# Patient Record
Sex: Female | Born: 1975 | Race: Black or African American | Hispanic: No | Marital: Single | Smoking: Never smoker
Health system: Southern US, Community
[De-identification: ages and names within clinical notes are randomized; demographics above are authoritative.]

## PROBLEM LIST (undated history)

## (undated) ENCOUNTER — Emergency Department (HOSPITAL_COMMUNITY): Discharge status: Absent without leave | Payer: 59

## (undated) DIAGNOSIS — D473 Essential (hemorrhagic) thrombocythemia: Secondary | ICD-10-CM

## (undated) DIAGNOSIS — B009 Herpesviral infection, unspecified: Secondary | ICD-10-CM

## (undated) HISTORY — PX: NO PAST SURGERIES: SHX2092

## (undated) HISTORY — DX: Herpesviral infection, unspecified: B00.9

---
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
## 1999-05-01 ENCOUNTER — Other Ambulatory Visit: Admission: RE | Admit: 1999-05-01 | Discharge: 1999-05-01 | Payer: Self-pay | Admitting: Obstetrics and Gynecology

## 2000-03-13 ENCOUNTER — Emergency Department (HOSPITAL_COMMUNITY): Admission: EM | Admit: 2000-03-13 | Discharge: 2000-03-13 | Payer: Self-pay | Admitting: Emergency Medicine

## 2000-05-01 ENCOUNTER — Other Ambulatory Visit: Admission: RE | Admit: 2000-05-01 | Discharge: 2000-05-01 | Payer: Self-pay | Admitting: *Deleted

## 2001-05-07 ENCOUNTER — Other Ambulatory Visit: Admission: RE | Admit: 2001-05-07 | Discharge: 2001-05-07 | Payer: Self-pay | Admitting: Obstetrics and Gynecology

## 2002-05-12 ENCOUNTER — Other Ambulatory Visit: Admission: RE | Admit: 2002-05-12 | Discharge: 2002-05-12 | Payer: Self-pay | Admitting: Obstetrics and Gynecology

## 2003-05-24 ENCOUNTER — Other Ambulatory Visit: Admission: RE | Admit: 2003-05-24 | Discharge: 2003-05-24 | Payer: Self-pay | Admitting: Obstetrics and Gynecology

## 2008-07-16 LAB — CONVERTED CEMR LAB: Pap Smear: NORMAL

## 2008-12-29 ENCOUNTER — Ambulatory Visit: Payer: Self-pay | Admitting: Internal Medicine

## 2008-12-29 DIAGNOSIS — R42 Dizziness and giddiness: Secondary | ICD-10-CM | POA: Insufficient documentation

## 2008-12-29 DIAGNOSIS — A630 Anogenital (venereal) warts: Secondary | ICD-10-CM | POA: Insufficient documentation

## 2009-03-07 ENCOUNTER — Ambulatory Visit: Payer: Self-pay | Admitting: Internal Medicine

## 2009-03-08 LAB — CONVERTED CEMR LAB
ALT: 23 units/L (ref 0–35)
AST: 22 units/L (ref 0–37)
Albumin: 3.9 g/dL (ref 3.5–5.2)
Basophils Absolute: 0 10*3/uL (ref 0.0–0.1)
Bilirubin Urine: NEGATIVE
Calcium: 9 mg/dL (ref 8.4–10.5)
Cholesterol: 173 mg/dL (ref 0–200)
Creatinine, Ser: 1 mg/dL (ref 0.4–1.2)
GFR calc non Af Amer: 66.9 mL/min (ref >60)
HCT: 42.4 % (ref 36.0–46.0)
HDL: 72.6 mg/dL (ref >39.00)
Hemoglobin: 14 g/dL (ref 12.0–15.0)
Ketones, ur: NEGATIVE mg/dL
Leukocytes, UA: NEGATIVE
Lymphs Abs: 2.1 10*3/uL (ref 0.7–4.0)
MCV: 93.1 fL (ref 78.0–100.0)
Monocytes Absolute: 0.7 10*3/uL (ref 0.1–1.0)
Monocytes Relative: 11.5 % (ref 3.0–12.0)
Neutro Abs: 3.2 10*3/uL (ref 1.4–7.7)
Nitrite: NEGATIVE
Platelets: 361 10*3/uL (ref 150.0–400.0)
RDW: 12.8 % (ref 11.5–14.6)
Sodium: 140 meq/L (ref 135–145)
TSH: 0.97 microintl units/mL (ref 0.35–5.50)
Total Bilirubin: 2.1 mg/dL — ABNORMAL HIGH (ref 0.3–1.2)
Total CHOL/HDL Ratio: 2
Triglycerides: 35 mg/dL (ref 0.0–149.0)
Urobilinogen, UA: 0.2 (ref 0.0–1.0)
pH: 7 (ref 5.0–8.0)

## 2009-03-10 ENCOUNTER — Ambulatory Visit: Payer: Self-pay | Admitting: Internal Medicine

## 2009-03-10 DIAGNOSIS — R599 Enlarged lymph nodes, unspecified: Secondary | ICD-10-CM | POA: Insufficient documentation

## 2009-03-29 ENCOUNTER — Encounter: Admission: RE | Admit: 2009-03-29 | Discharge: 2009-03-29 | Payer: Self-pay | Admitting: Internal Medicine

## 2009-03-30 ENCOUNTER — Encounter (INDEPENDENT_AMBULATORY_CARE_PROVIDER_SITE_OTHER): Payer: Self-pay | Admitting: *Deleted

## 2009-06-24 ENCOUNTER — Encounter: Admission: RE | Admit: 2009-06-24 | Discharge: 2009-08-04 | Payer: Self-pay | Admitting: Orthopedic Surgery

## 2010-12-07 ENCOUNTER — Encounter: Payer: Self-pay | Admitting: Internal Medicine

## 2010-12-11 ENCOUNTER — Other Ambulatory Visit: Payer: Self-pay | Admitting: Internal Medicine

## 2010-12-11 ENCOUNTER — Other Ambulatory Visit (INDEPENDENT_AMBULATORY_CARE_PROVIDER_SITE_OTHER): Payer: BC Managed Care – PPO

## 2010-12-11 DIAGNOSIS — Z0000 Encounter for general adult medical examination without abnormal findings: Secondary | ICD-10-CM

## 2010-12-11 LAB — CBC WITH DIFFERENTIAL/PLATELET
Eosinophils Absolute: 0.1 10*3/uL (ref 0.0–0.7)
Eosinophils Relative: 2 % (ref 0.0–5.0)
HCT: 39.7 % (ref 36.0–46.0)
Lymphs Abs: 2.1 10*3/uL (ref 0.7–4.0)
MCHC: 34 g/dL (ref 30.0–36.0)
MCV: 91.5 fl (ref 78.0–100.0)
Monocytes Absolute: 0.7 10*3/uL (ref 0.1–1.0)
Platelets: 366 10*3/uL (ref 150.0–400.0)
WBC: 7 10*3/uL (ref 4.5–10.5)

## 2010-12-11 LAB — URINALYSIS
Ketones, ur: NEGATIVE
Specific Gravity, Urine: 1.025 (ref 1.000–1.030)
Total Protein, Urine: NEGATIVE
Urine Glucose: NEGATIVE
Urobilinogen, UA: 0.2 (ref 0.0–1.0)
pH: 6.5 (ref 5.0–8.0)

## 2010-12-11 LAB — HEPATIC FUNCTION PANEL
ALT: 17 U/L (ref 0–35)
AST: 22 U/L (ref 0–37)
Alkaline Phosphatase: 42 U/L (ref 39–117)
Bilirubin, Direct: 0.2 mg/dL (ref 0.0–0.3)
Total Bilirubin: 2.2 mg/dL — ABNORMAL HIGH (ref 0.3–1.2)

## 2010-12-11 LAB — LIPID PANEL
Cholesterol: 170 mg/dL (ref 0–200)
Triglycerides: 37 mg/dL (ref 0.0–149.0)

## 2010-12-11 LAB — BASIC METABOLIC PANEL
BUN: 12 mg/dL (ref 6–23)
Calcium: 8.9 mg/dL (ref 8.4–10.5)
Creatinine, Ser: 1 mg/dL (ref 0.4–1.2)
GFR: 84.02 mL/min (ref >60.00)

## 2010-12-14 ENCOUNTER — Ambulatory Visit (INDEPENDENT_AMBULATORY_CARE_PROVIDER_SITE_OTHER): Payer: BC Managed Care – PPO | Admitting: Internal Medicine

## 2010-12-14 ENCOUNTER — Encounter: Payer: Self-pay | Admitting: Internal Medicine

## 2010-12-14 VITALS — BP 100/62 | HR 72 | Temp 98.8°F | Ht 63.0 in | Wt 122.8 lb

## 2010-12-14 DIAGNOSIS — Z0000 Encounter for general adult medical examination without abnormal findings: Secondary | ICD-10-CM

## 2010-12-14 DIAGNOSIS — M6740 Ganglion, unspecified site: Secondary | ICD-10-CM

## 2010-12-14 NOTE — Patient Instructions (Signed)
It was good to see you today. We have reviewed your prior records including labs and tests today - exam looks great Stay active and eat well to maintain health as we discussed Please schedule followup annually for exam and labs, call sooner if problems. Let me know if the "lump" on your right hand causes any problems or any changes for evaluation by hand specialist

## 2010-12-14 NOTE — Progress Notes (Signed)
Subjective:    Patient ID: Darlene Davis, female    DOB: 02/03/1976, 35 y.o.   MRN: [id number]  HPI patient is here today for annual physical. Patient feels well and has no complaints. Past Medical History  Diagnosis Date  . VENEREAL WART   . POSTURAL LIGHTHEADEDNESS 12/29/2008  . 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 Hypertension Mother   . Hyperlipidemia Maternal Uncle   . Diabetes Maternal Grandmother    History  Substance Use Topics  . Smoking status: Never Smoker   . Smokeless tobacco: Not on file   Comment: Single-lives alone  . Alcohol Use:     Review of Systems  Constitutional: Negative for fever.  Respiratory: Negative for cough and shortness of breath.   Cardiovascular: Negative for chest pain.  Gastrointestinal: Negative for abdominal pain.  Musculoskeletal: Negative for gait problem.  Skin: Negative for rash.  Neurological: Negative for dizziness.  No other specific complaints in a complete review of systems (except as listed in HPI above).     Objective:   Physical Exam BP 100/62  Pulse 72  Temp(Src) 98.8 F (37.1 C) (Oral)  Ht 5\' 3"  (1.6 m)  Wt 122 lb 12.8 oz (55.702 kg)  BMI 21.75 kg/m2  SpO2 99% Wt Readings from Last 3 Encounters:  12/14/10 122 lb 12.8 oz (55.702 kg)  03/10/09 117 lb (53.071 kg)  12/29/08 120 lb (54.432 kg)   Physical Exam  Constitutional: She is oriented to person, place, and time. She appears well-developed and well-nourished. No distress.  Fit, healthy HENT: Head: Normocephalic and atraumatic. Ears: TMs B clear without effusion and erythema Nose: Nose normal.  Mouth/Throat: Oropharynx is clear and moist. No oropharyngeal exudate.  Eyes: Conjunctivae and EOM are normal. Pupils are equal, round, and reactive to light. No scleral icterus.  Neck: Normal range of motion. Neck supple. No JVD present. No thyromegaly present.  Cardiovascular: Normal rate, regular  rhythm and normal heart sounds.  No murmur heard. Pulmonary/Chest: Effort normal and breath sounds normal. No respiratory distress. She has no wheezes.  Abdominal: Soft. Bowel sounds are normal. She exhibits no distension. There is no tenderness.  Musculoskeletal: Normal range of motion. She exhibits no edema. Ganglion cyst off flexor tendon of R 2nd finger under MCP region of palm - firm nontender "BB" -  Neurological: She is alert and oriented to person, place, and time. No cranial nerve deficit. Coordination normal.  Skin: Skin is warm and dry. No rash noted. No erythema.  Psychiatric: She has a normal mood and affect. Her behavior is normal. Judgment and thought content normal.   Lab Results  Component Value Date   WBC 7.0 12/11/2010   HGB 13.5 12/11/2010   HCT 39.7 12/11/2010   PLT 366.0 12/11/2010   CHOL 170 12/11/2010   TRIG 37.0 12/11/2010   HDL 63.30 12/11/2010   ALT 17 12/11/2010   AST 22 12/11/2010   NA 139 12/11/2010   K 4.5 12/11/2010   CL 105 12/11/2010   CREATININE 1.0 12/11/2010   BUN 12 12/11/2010   CO2 28 12/11/2010   TSH 0.87 12/11/2010        Assessment & Plan:  CPX - v70.0 - Patient has been counseled on age-appropriate routine health concerns for screening and prevention. These are reviewed and up-to-date. Immunizations are up-to-date or declined. Labs and ECG reviewed.  Ganglion cyst off flexion tendon R 2nd finger -  education and reassurance provided - will call if becomes painful or tender or interfering with grip activity

## 2011-04-08 IMAGING — US US PELVIS COMPLETE
1 series · 14 of 25 positions shown · non-contrast
Comparison: None

CLINICAL DATA: Left inguinal lymphadenopathy.  Evaluate for ovarian
or pelvic mass.

TRANSABDOMINAL AND TRANSVAGINAL ULTRASOUND OF PELVIS
TECHNIQUE: Both transabdominal and transvaginal ultrasound
examinations of the pelvis were performed including evaluation of
the uterus, ovaries, adnexal regions, and pelvic cul-de-sac.

[Series 1: us pelvis complete · 0.27mm/px · 14 of 70 slices shown]
[im 1/70]
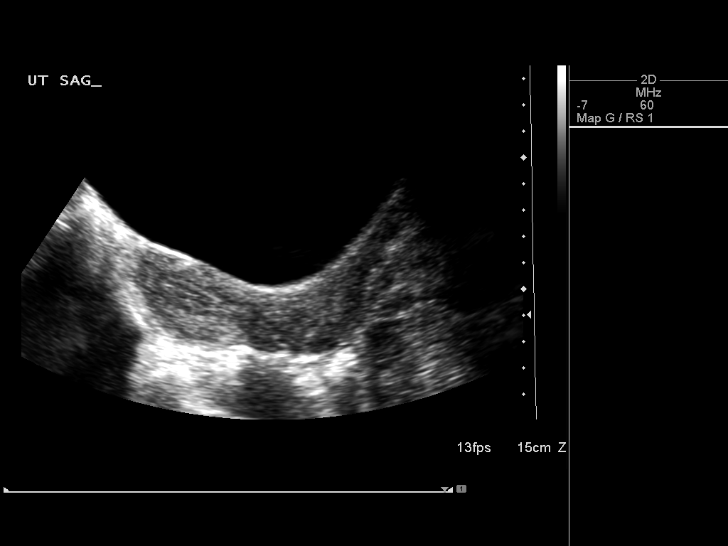
[im 6/70]
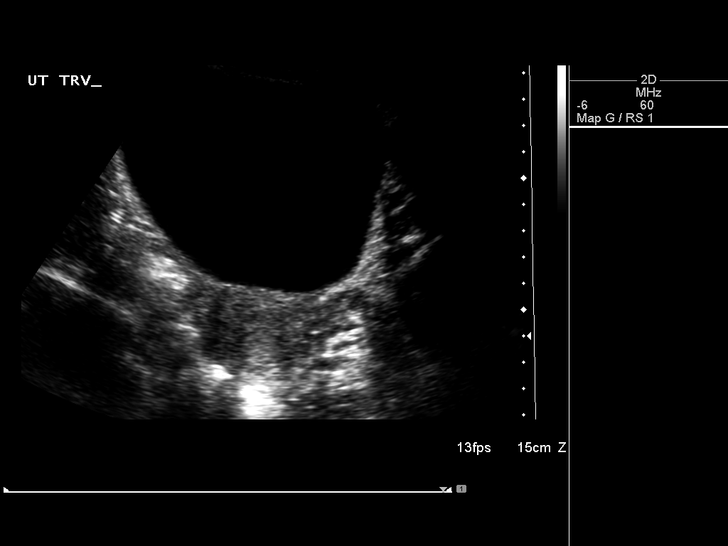
[im 12/70]
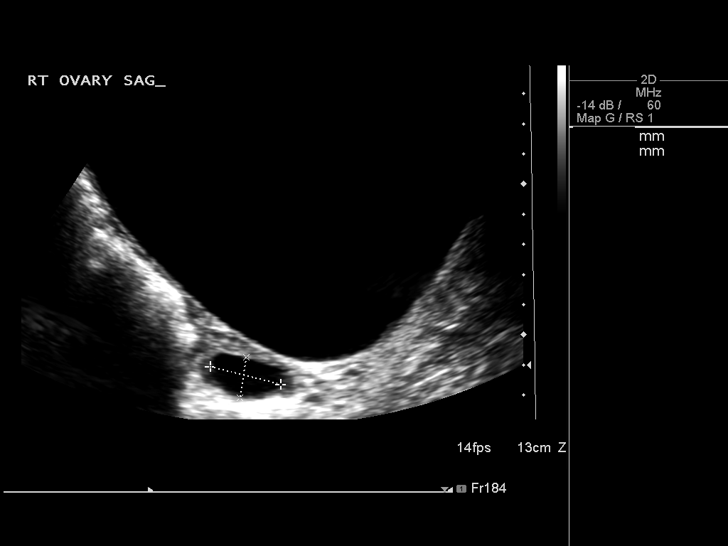
[im 18/70]
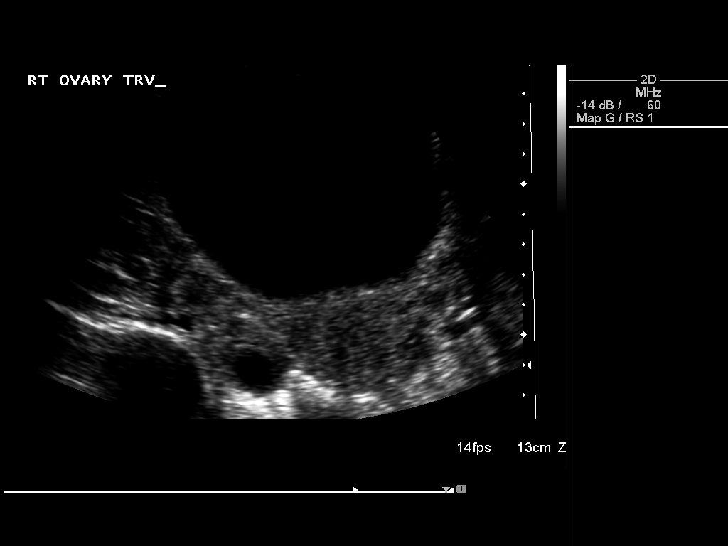
[im 24/70]
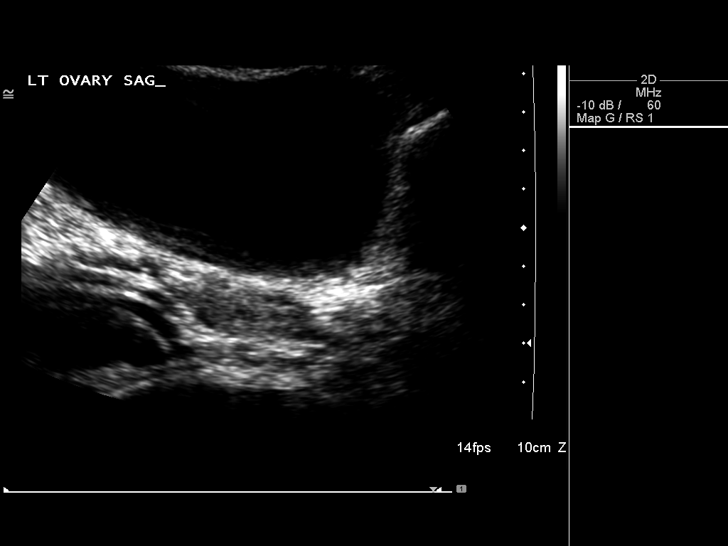
[im 26/70]
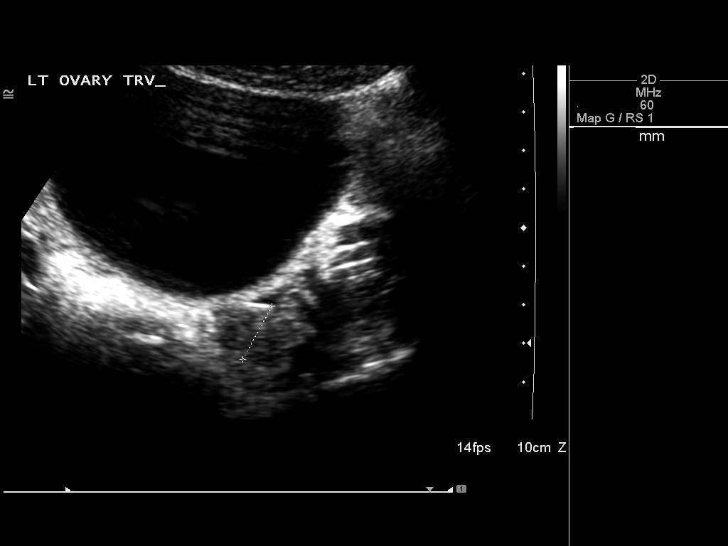
[im 32/70]
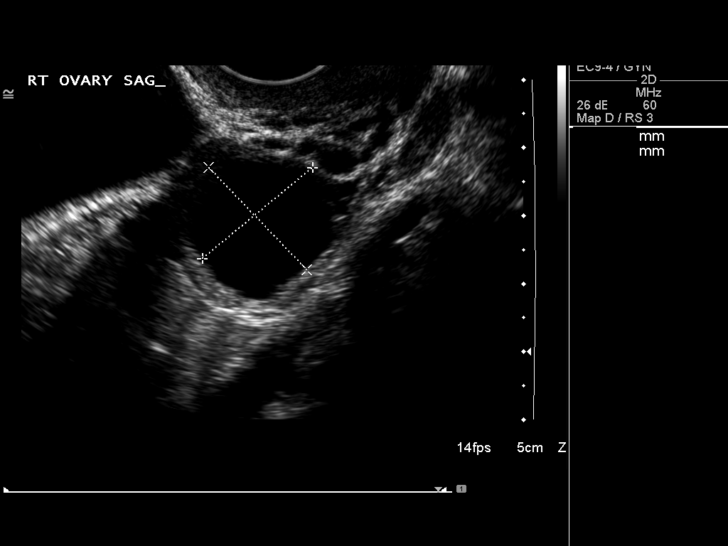
[im 38/70]
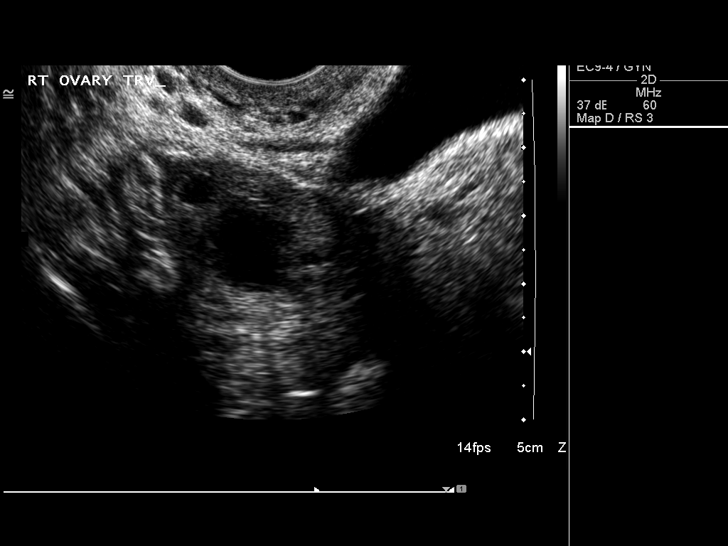
[im 44/70]
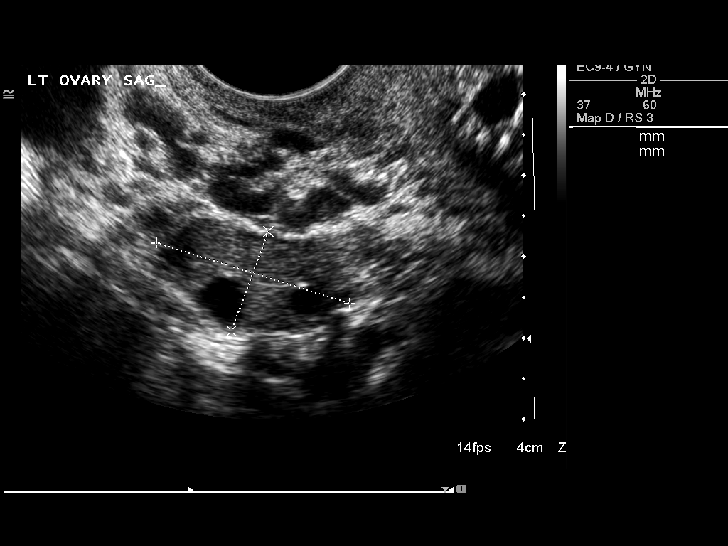
[im 47/70]
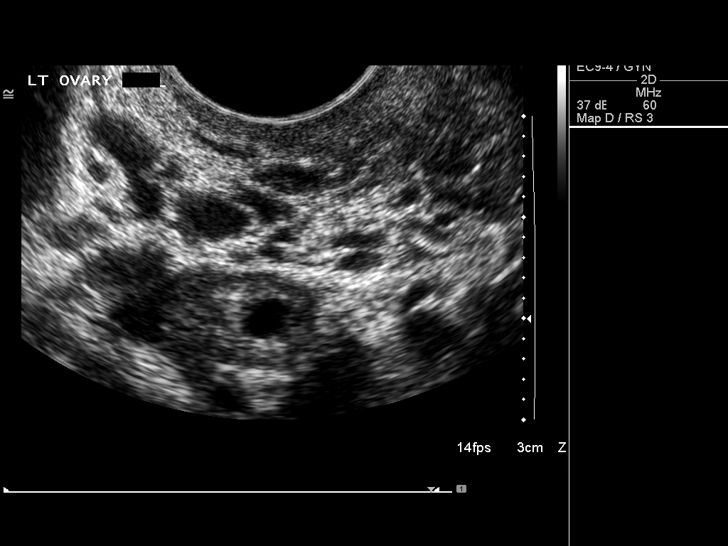
[im 52/70]
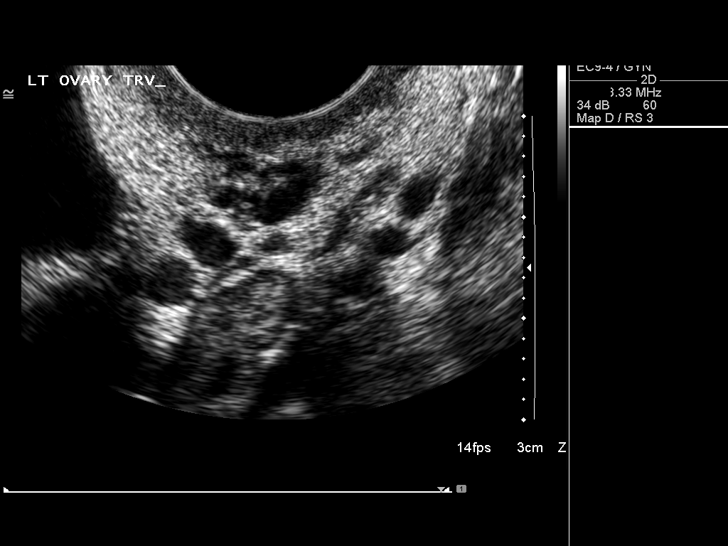
[im 58/70]
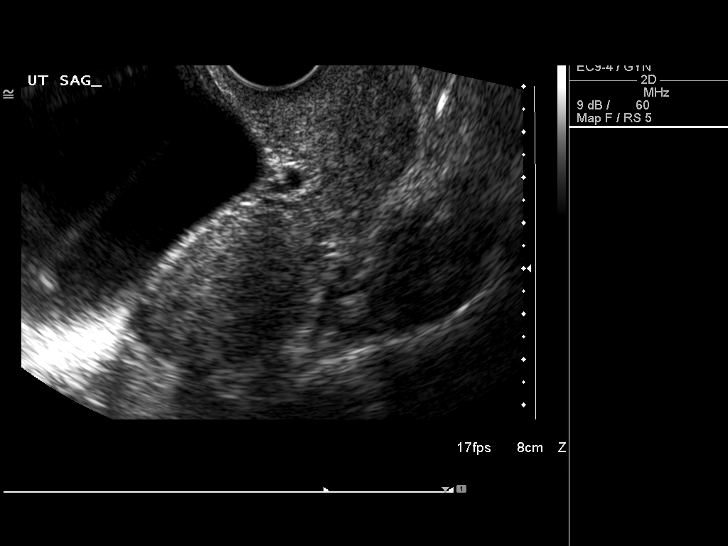
[im 64/70]
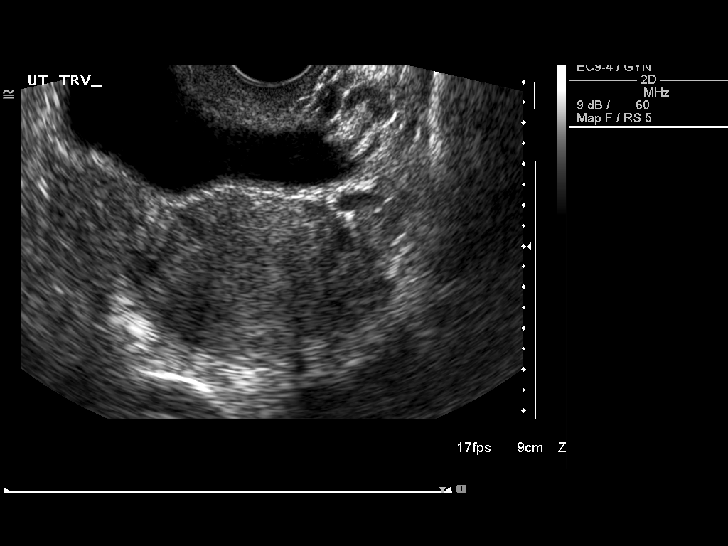
[im 70/70]
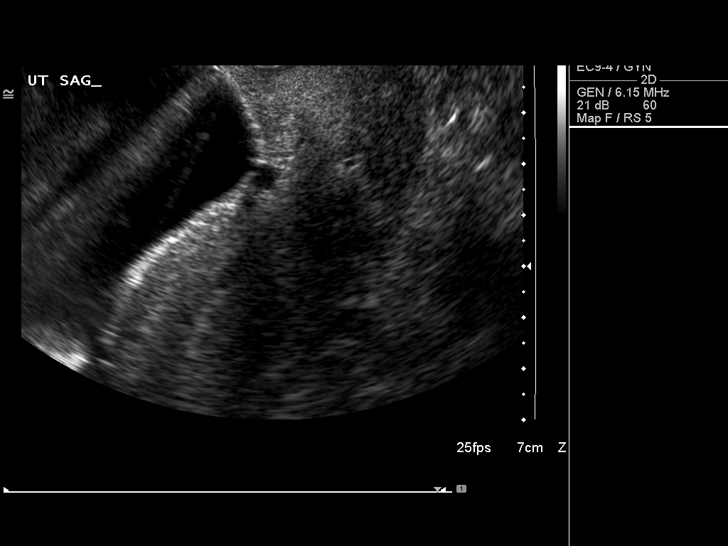

[14 of 25 positions shown; findings below may reference images not displayed]

FINDINGS: Uterus measures 8.0 x 3.1 x 5.7 cm. No fibroids or other uterine
masses identified.

Endometrium measures 7 mm in thickness.  Within normal limits in
appearance.

Right Ovary measures 3.5 x 2.4 x 2.6 cm. Normal appearance.  2 cm
dominant follicle noted.

Left Ovary measures 2.5 x 1.3 x 1.3 cm.  Normal appearance.

Other Findings:  No adnexal mass or free fluid identified.
IMPRESSION: Normal study.  No evidence of pelvic or ovarian mass.

## 2011-12-07 ENCOUNTER — Telehealth: Payer: Self-pay | Admitting: *Deleted

## 2011-12-07 DIAGNOSIS — Z0000 Encounter for general adult medical examination without abnormal findings: Secondary | ICD-10-CM

## 2011-12-07 NOTE — Telephone Encounter (Signed)
Received staff msg pt made cpx appt need labs enterted in [facility]... 12/07/11@8 :56am/LMB

## 2011-12-07 NOTE — Telephone Encounter (Signed)
Message copied by Deatra James on Fri Dec 07, 2011  8:56 AM ------      Message from: COUSIN, [place] T      Created: Fri Dec 07, 2011  8:52 AM      Regarding: PHY DATE:  02/22/12       THANKS

## 2011-12-13 ENCOUNTER — Other Ambulatory Visit: Payer: BC Managed Care – PPO

## 2011-12-18 ENCOUNTER — Encounter: Payer: BC Managed Care – PPO | Admitting: Internal Medicine

## 2012-02-19 ENCOUNTER — Other Ambulatory Visit (INDEPENDENT_AMBULATORY_CARE_PROVIDER_SITE_OTHER): Payer: BC Managed Care – PPO

## 2012-02-19 DIAGNOSIS — Z0000 Encounter for general adult medical examination without abnormal findings: Secondary | ICD-10-CM

## 2012-02-19 LAB — BASIC METABOLIC PANEL
BUN: 8 mg/dL (ref 6–23)
CO2: 28 mEq/L (ref 19–32)
Chloride: 105 mEq/L (ref 96–112)
Creatinine, Ser: 1 mg/dL (ref 0.4–1.2)
Glucose, Bld: 82 mg/dL (ref 70–99)

## 2012-02-19 LAB — CBC WITH DIFFERENTIAL/PLATELET
Basophils Relative: 1 % (ref 0.0–3.0)
Hemoglobin: 13.1 g/dL (ref 12.0–15.0)
Lymphocytes Relative: 34.9 % (ref 12.0–46.0)
Monocytes Relative: 9.6 % (ref 3.0–12.0)
Neutro Abs: 3.6 10*3/uL (ref 1.4–7.7)
RBC: 4.41 Mil/uL (ref 3.87–5.11)

## 2012-02-19 LAB — HEPATIC FUNCTION PANEL
ALT: 17 U/L (ref 0–35)
AST: 23 U/L (ref 0–37)
Albumin: 3.6 g/dL (ref 3.5–5.2)

## 2012-02-19 LAB — LIPID PANEL
Cholesterol: 183 mg/dL (ref 0–200)
LDL Cholesterol: 94 mg/dL (ref 0–99)
Total CHOL/HDL Ratio: 2

## 2012-02-19 LAB — URINALYSIS, ROUTINE W REFLEX MICROSCOPIC
Bilirubin Urine: NEGATIVE
Hgb urine dipstick: NEGATIVE
Leukocytes, UA: NEGATIVE
Nitrite: NEGATIVE
Total Protein, Urine: NEGATIVE

## 2012-02-22 ENCOUNTER — Encounter: Payer: Self-pay | Admitting: Internal Medicine

## 2012-02-22 ENCOUNTER — Other Ambulatory Visit: Payer: Self-pay

## 2012-02-22 ENCOUNTER — Ambulatory Visit (INDEPENDENT_AMBULATORY_CARE_PROVIDER_SITE_OTHER): Payer: 59 | Admitting: Internal Medicine

## 2012-02-22 VITALS — BP 124/68 | HR 76 | Temp 97.8°F | Ht 63.0 in | Wt 124.2 lb

## 2012-02-22 DIAGNOSIS — Z0000 Encounter for general adult medical examination without abnormal findings: Secondary | ICD-10-CM

## 2012-02-22 NOTE — Progress Notes (Signed)
  Subjective:    Patient ID: Darlene Davis, female    DOB: 1976-01-09, 36 y.o.   MRN: [id number]  HPI  patient is here today for annual physical. Patient feels well and has no complaints.  Past Medical History  Diagnosis Date  . 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 Hypertension Mother   . Hyperlipidemia Maternal Uncle   . Diabetes Maternal Grandmother    History  Substance Use Topics  . Smoking status: Never Smoker   . Smokeless tobacco: Not on file   Comment: Single-lives alone  . Alcohol Use:     Review of Systems Constitutional: Negative for fever or weight change.  Respiratory: Negative for cough and shortness of breath.   Cardiovascular: Negative for chest pain or palpitations.  Gastrointestinal: Negative for abdominal pain, no bowel changes.  Musculoskeletal: Negative for gait problem or joint swelling.  Skin: Negative for rash.  Neurological: Negative for dizziness or headache.  No other specific complaints in a complete review of systems (except as listed in HPI above).     Objective:   Physical Exam BP 124/68  Pulse 76  Temp 97.8 F (36.6 C) (Temporal)  Ht 5\' 3"  (1.6 m)  Wt 124 lb 3.2 oz (56.337 kg)  BMI 22.00 kg/m2  SpO2 98%  LMP 02/20/2012 Wt Readings from Last 3 Encounters:  02/22/12 124 lb 3.2 oz (56.337 kg)  12/14/10 122 lb 12.8 oz (55.702 kg)  03/10/09 117 lb (53.071 kg)   Constitutional: She appears well-developed and well-nourished. No distress.  HENT: Head: Normocephalic and atraumatic. Ears: B TMs ok, no erythema or effusion; Nose: Nose normal.  Mouth/Throat: Oropharynx is clear and moist. No oropharyngeal exudate.  Eyes: Conjunctivae and EOM are normal. Pupils are equal, round, and reactive to light. No scleral icterus.  Neck: Normal range of motion. Neck supple. No JVD present. No thyromegaly present.  Cardiovascular: Normal rate, regular rhythm and normal heart sounds.  No  murmur heard. No BLE edema. Pulmonary/Chest: Effort normal and breath sounds normal. No respiratory distress. She has no wheezes.  Abdominal: Soft. Bowel sounds are normal. She exhibits no distension. There is no tenderness. no masses Musculoskeletal: Normal range of motion, no joint effusions. No gross deformities Neurological: She is alert and oriented to person, place, and time. No cranial nerve deficit. Coordination normal.  Skin: Skin is warm and dry. No rash noted. No erythema.  Psychiatric: She has a normal mood and affect. Her behavior is normal. Judgment and thought content normal.   Lab Results  Component Value Date   WBC 7.0 02/19/2012   HGB 13.1 02/19/2012   HCT 39.9 02/19/2012   PLT 415.0* 02/19/2012   GLUCOSE 82 02/19/2012   CHOL 183 02/19/2012   TRIG 47.0 02/19/2012   HDL 79.20 02/19/2012   LDLCALC 94 02/19/2012   ALT 17 02/19/2012   AST 23 02/19/2012   NA 140 02/19/2012   K 4.4 02/19/2012   CL 105 02/19/2012   CREATININE 1.0 02/19/2012   BUN 8 02/19/2012   CO2 28 02/19/2012   TSH 0.89 02/19/2012        Assessment & Plan:   CPX/v70.0 - Patient has been counseled on age-appropriate routine health concerns for screening and prevention. These are reviewed and up-to-date. Immunizations are up-to-date or declined. Labs reviewed.

## 2012-02-22 NOTE — Patient Instructions (Addendum)
It was good to see you today. We have reviewed your prior records including labs and tests today Health Maintenance reviewed - all recommended immunizations and age-appropriate screenings are up-to-date. Medications reviewed, no changes at this time. Please schedule followup in 1 year for your medical physical/labs, call sooner if problems. Health Maintenance, Females A healthy lifestyle and preventative care can promote health and wellness.  Maintain regular health, dental, and eye exams.   Eat a healthy diet. Foods like vegetables, fruits, whole grains, low-fat dairy products, and lean protein foods contain the nutrients you need without too many calories. Decrease your intake of foods high in solid fats, added sugars, and salt. Get information about a proper diet from your caregiver, if necessary.   Regular physical exercise is one of the most important things you can do for your health. Most adults should get at least 150 minutes of moderate-intensity exercise (any activity that increases your heart rate and causes you to sweat) each week. In addition, most adults need muscle-strengthening exercises on 2 or more days a week.     Maintain a healthy weight. The body mass index (BMI) is a screening tool to identify possible weight problems. It provides an estimate of body fat based on height and weight. Your caregiver can help determine your BMI, and can help you achieve or maintain a healthy weight. For adults 20 years and older:   A BMI below 18.5 is considered underweight.   A BMI of 18.5 to 24.9 is normal.   A BMI of 25 to 29.9 is considered overweight.   A BMI of 30 and above is considered obese.   Maintain normal blood lipids and cholesterol by exercising and minimizing your intake of saturated fat. Eat a balanced diet with plenty of fruits and vegetables. Blood tests for lipids and cholesterol should begin at age 20 and be repeated every 5 years. If your lipid or cholesterol levels  are high, you are over 50, or you are a high risk for heart disease, you may need your cholesterol levels checked more frequently. Ongoing high lipid and cholesterol levels should be treated with medicines if diet and exercise are not effective.   If you smoke, find out from your caregiver how to quit. If you do not use tobacco, do not start.   If you are pregnant, do not drink alcohol. If you are breastfeeding, be very cautious about drinking alcohol. If you are not pregnant and choose to drink alcohol, do not exceed 1 drink per day. One drink is considered to be 12 ounces (355 mL) of beer, 5 ounces (148 mL) of wine, or 1.5 ounces (44 mL) of liquor.   Avoid use of street drugs. Do not share needles with anyone. Ask for help if you need support or instructions about stopping the use of drugs.   High blood pressure causes heart disease and increases the risk of stroke. Blood pressure should be checked at least every 1 to 2 years. Ongoing high blood pressure should be treated with medicines, if weight loss and exercise are not effective.   If you are 55 to 38 years old, ask your caregiver if you should take aspirin to prevent strokes.   Diabetes screening involves taking a blood sample to check your fasting blood sugar level. This should be done once every 3 years, after age 45, if you are within normal weight and without risk factors for diabetes. Testing should be considered at a younger age or be carried out   more frequently if you are overweight and have at least 1 risk factor for diabetes.   Breast cancer screening is essential preventative care for women. You should practice "breast self-awareness." This means understanding the normal appearance and feel of your breasts and may include breast self-examination. Any changes detected, no matter how small, should be reported to a caregiver. Women in their 20s and 30s should have a clinical breast exam (CBE) by a caregiver as part of a regular health  exam every 1 to 3 years. After age 40, women should have a CBE every year. Starting at age 40, women should consider having a mammogram (breast X-ray) every year. Women who have a family history of breast cancer should talk to their caregiver about genetic screening. Women at a high risk of breast cancer should talk to their caregiver about having an MRI and a mammogram every year.   The Pap test is a screening test for cervical cancer. Women should have a Pap test starting at age 21. Between ages 21 and 29, Pap tests should be repeated every 2 years. Beginning at age 30, you should have a Pap test every 3 years as long as the past 3 Pap tests have been normal. If you had a hysterectomy for a problem that was not cancer or a condition that could lead to cancer, then you no longer need Pap tests. If you are between ages 65 and 70, and you have had normal Pap tests going back 10 years, you no longer need Pap tests. If you have had past treatment for cervical cancer or a condition that could lead to cancer, you need Pap tests and screening for cancer for at least 20 years after your treatment. If Pap tests have been discontinued, risk factors (such as a new sexual partner) need to be reassessed to determine if screening should be resumed. Some women have medical problems that increase the chance of getting cervical cancer. In these cases, your caregiver may recommend more frequent screening and Pap tests.   The human papillomavirus (HPV) test is an additional test that may be used for cervical cancer screening. The HPV test looks for the virus that can cause the cell changes on the cervix. The cells collected during the Pap test can be tested for HPV. The HPV test could be used to screen women aged 30 years and older, and should be used in women of any age who have unclear Pap test results. After the age of 30, women should have HPV testing at the same frequency as a Pap test.   Colorectal cancer can be detected  and often prevented. Most routine colorectal cancer screening begins at the age of 50 and continues through age 75. However, your caregiver may recommend screening at an earlier age if you have risk factors for colon cancer. On a yearly basis, your caregiver may provide home test kits to check for hidden blood in the stool. Use of a small camera at the end of a tube, to directly examine the colon (sigmoidoscopy or colonoscopy), can detect the earliest forms of colorectal cancer. Talk to your caregiver about this at age 50, when routine screening begins. Direct examination of the colon should be repeated every 5 to 10 years through age 75, unless early forms of pre-cancerous polyps or small growths are found.   Hepatitis C blood testing is recommended for all people born from 1945 through 1965 and any individual with known risks for hepatitis C.   Practice   safe sex. Use condoms and avoid high-risk sexual practices to reduce the spread of sexually transmitted infections (STIs). Sexually active women aged 25 and younger should be checked for Chlamydia, which is a common sexually transmitted infection. Older women with new or multiple partners should also be tested for Chlamydia. Testing for other STIs is recommended if you are sexually active and at increased risk.   Osteoporosis is a disease in which the bones lose minerals and strength with aging. This can result in serious bone fractures. The risk of osteoporosis can be identified using a bone density scan. Women ages 65 and over and women at risk for fractures or osteoporosis should discuss screening with their caregivers. Ask your caregiver whether you should be taking a calcium supplement or vitamin D to reduce the rate of osteoporosis.   Menopause can be associated with physical symptoms and risks. Hormone replacement therapy is available to decrease symptoms and risks. You should talk to your caregiver about whether hormone replacement therapy is right  for you.   Use sunscreen with a sun protection factor (SPF) of 30 or greater. Apply sunscreen liberally and repeatedly throughout the day. You should seek shade when your shadow is shorter than you. Protect yourself by wearing long sleeves, pants, a wide-brimmed hat, and sunglasses year round, whenever you are outdoors.   Notify your caregiver of new moles or changes in moles, especially if there is a change in shape or color. Also notify your caregiver if a mole is larger than the size of a pencil eraser.   Stay current with your immunizations.  Document Released: 02/05/2011 Document Revised: 07/12/2011 Document Reviewed: 02/05/2011 [employer] Patient Information 2012 [employer]. 

## 2012-05-20 ENCOUNTER — Ambulatory Visit (INDEPENDENT_AMBULATORY_CARE_PROVIDER_SITE_OTHER): Payer: 59 | Admitting: *Deleted

## 2012-05-20 DIAGNOSIS — Z23 Encounter for immunization: Secondary | ICD-10-CM

## 2012-06-16 ENCOUNTER — Encounter: Payer: Self-pay | Admitting: Internal Medicine

## 2012-06-16 ENCOUNTER — Ambulatory Visit (INDEPENDENT_AMBULATORY_CARE_PROVIDER_SITE_OTHER): Payer: 59 | Admitting: Internal Medicine

## 2012-06-16 VITALS — BP 114/72 | HR 80 | Temp 99.0°F | Ht 63.0 in

## 2012-06-16 DIAGNOSIS — J019 Acute sinusitis, unspecified: Secondary | ICD-10-CM

## 2012-06-16 MED ORDER — AMOXICILLIN-POT CLAVULANATE 875-125 MG PO TABS: 1.0000 | ORAL_TABLET | Freq: Two times a day (BID) | ORAL | Status: AC

## 2012-06-16 NOTE — Progress Notes (Signed)
  Subjective:    HPI  complains of head cold symptoms  Onset >1 week ago, wax/wane symptoms  associated with rhinorrhea, sneezing, sore throat, mild headache and low grade fever - mild bloody tinge to nasal discharge this AM Also myalgias, sinus pressure and mild chest congestion Mod but incomplete relief with OTC meds ? If precipitated by sick contacts  Past Medical History  Diagnosis Date  . 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    Review of Systems Constitutional: No fever or night sweats, no unexpected weight change Pulmonary: No pleurisy or hemoptysis Cardiovascular: No chest pain or palpitations     Objective:   Physical Exam BP 114/72  Pulse 80  Temp 99 F (37.2 C) (Oral)  Ht 5\' 3"  (1.6 m)  SpO2 99% GEN: mildly ill appearing and audible head/chest congestion HENT: NCAT, mild sinus tenderness bilaterally, nares with thick discharge - no ulceration or lesion, oropharynx mild erythema, no exudate Eyes: Vision grossly intact, no conjunctivitis Lungs: Clear to auscultation without rhonchi or wheeze, no increased work of breathing Cardiovascular: Regular rate and rhythm, no bilateral edema      Assessment & Plan:  Viral URI  - progression to acute sinusitis    Empiric antibiotics prescribed due to symptom duration greater than 7 days recommended OTC cough suppression -  Symptomatic care with Tylenol or Advil, hydration and rest -  salt gargle advised as needed

## 2012-06-16 NOTE — Patient Instructions (Addendum)
It was good to see you today. Augmentin antibiotics - Your prescription(s) have been submitted to your pharmacy. Please take as directed and contact our office if you believe you are having problem(s) with the medication(s). Alternate between ibuprofen and tylenol for aches, pain and fever symptoms as discussed Hydrate, rest and call if worse or unimprovedSinusitis Sinusitis is redness, soreness, and swelling (inflammation) of the paranasal sinuses. Paranasal sinuses are air pockets within the bones of your face (beneath the eyes, the middle of the forehead, or above the eyes). In healthy paranasal sinuses, mucus is able to drain out, and air is able to circulate through them by way of your nose. However, when your paranasal sinuses are inflamed, mucus and air can become trapped. This can allow bacteria and other germs to grow and cause infection. Sinusitis can develop quickly and last only a short time (acute) or continue over a long period (chronic). Sinusitis that lasts for more than 12 weeks is considered chronic.   CAUSES   Causes of sinusitis include:  Allergies.   Structural abnormalities, such as displacement of the cartilage that separates your nostrils (deviated septum), which can decrease the air flow through your nose and sinuses and affect sinus drainage.   Functional abnormalities, such as when the small hairs (cilia) that line your sinuses and help remove mucus do not work properly or are not present.  SYMPTOMS   Symptoms of acute and chronic sinusitis are the same. The primary symptoms are pain and pressure around the affected sinuses. Other symptoms include:  Upper toothache.   Earache.   Headache.   Bad breath.   Decreased sense of smell and taste.   A cough, which worsens when you are lying flat.   Fatigue.   Fever.   Thick drainage from your nose, which often is green and may contain pus (purulent).   Swelling and warmth over the affected sinuses.  DIAGNOSIS     Your caregiver will perform a physical exam. During the exam, your caregiver may:  Look in your nose for signs of abnormal growths in your nostrils (nasal polyps).   Tap over the affected sinus to check for signs of infection.   View the inside of your sinuses (endoscopy) with a special imaging device with a light attached (endoscope), which is inserted into your sinuses.  If your caregiver suspects that you have chronic sinusitis, one or more of the following tests may be recommended:  Allergy tests.   Nasal culture A sample of mucus is taken from your nose and sent to a lab and screened for bacteria.   Nasal cytology A sample of mucus is taken from your nose and examined by your caregiver to determine if your sinusitis is related to an allergy.  TREATMENT   Most cases of acute sinusitis are related to a viral infection and will resolve on their own within 10 days. Sometimes medicines are prescribed to help relieve symptoms (pain medicine, decongestants, nasal steroid sprays, or saline sprays).   However, for sinusitis related to a bacterial infection, your caregiver will prescribe antibiotic medicines. These are medicines that will help kill the bacteria causing the infection.   Rarely, sinusitis is caused by a fungal infection. In theses cases, your caregiver will prescribe antifungal medicine. For some cases of chronic sinusitis, surgery is needed. Generally, these are cases in which sinusitis recurs more than 3 times per year, despite other treatments. HOME CARE INSTRUCTIONS    Drink plenty of water. Water helps thin the  mucus so your sinuses can drain more easily.   Use a humidifier.   Inhale steam 3 to 4 times a day (for example, sit in the bathroom with the shower running).   Apply a warm, moist washcloth to your face 3 to 4 times a day, or as directed by your caregiver.   Use saline nasal sprays to help moisten and clean your sinuses.   Take over-the-counter or prescription  medicines for pain, discomfort, or fever only as directed by your caregiver.  SEEK IMMEDIATE MEDICAL CARE IF:  You have increasing pain or severe headaches.   You have nausea, vomiting, or drowsiness.   You have swelling around your face.   You have vision problems.   You have a stiff neck.   You have difficulty breathing.  MAKE SURE YOU:    Understand these instructions.   Will watch your condition.   Will get help right away if you are not doing well or get worse.  Document Released: 07/23/2005 Document Revised: 10/15/2011 Document Reviewed: 08/07/2011 [facility] Patient Information 2013 [place], [place].

## 2012-10-06 ENCOUNTER — Telehealth: Payer: Self-pay | Admitting: Internal Medicine

## 2012-10-06 MED ORDER — SCOPOLAMINE 1 MG/3DAYS TD PT72: 1.0000 | MEDICATED_PATCH | TRANSDERMAL | Status: DC

## 2012-10-06 NOTE — Telephone Encounter (Signed)
Pt is aware.  

## 2012-10-06 NOTE — Telephone Encounter (Signed)
Going on a cruise in 2 wks.  Would like something for sea sickness.  Maybe the patch that goes behind her ear.  [facility] on [address].

## 2012-10-06 NOTE — Telephone Encounter (Signed)
erx for scopolamine patch done

## 2013-02-19 ENCOUNTER — Other Ambulatory Visit (INDEPENDENT_AMBULATORY_CARE_PROVIDER_SITE_OTHER): Payer: 59

## 2013-02-19 ENCOUNTER — Telehealth: Payer: Self-pay | Admitting: *Deleted

## 2013-02-19 DIAGNOSIS — Z0000 Encounter for general adult medical examination without abnormal findings: Secondary | ICD-10-CM

## 2013-02-19 LAB — CBC WITH DIFFERENTIAL/PLATELET
Basophils Absolute: 0.1 10*3/uL (ref 0.0–0.1)
Eosinophils Absolute: 0.2 10*3/uL (ref 0.0–0.7)
Lymphocytes Relative: 34 % (ref 12.0–46.0)
MCHC: 33.2 g/dL (ref 30.0–36.0)
MCV: 90.9 fl (ref 78.0–100.0)
Monocytes Absolute: 0.8 10*3/uL (ref 0.1–1.0)
Neutrophils Relative %: 51.8 % (ref 43.0–77.0)
RDW: 14.2 % (ref 11.5–14.6)

## 2013-02-19 LAB — HEPATIC FUNCTION PANEL
ALT: 23 U/L (ref 0–35)
AST: 21 U/L (ref 0–37)
Alkaline Phosphatase: 40 U/L (ref 39–117)
Bilirubin, Direct: 0.2 mg/dL (ref 0.0–0.3)
Total Protein: 7 g/dL (ref 6.0–8.3)

## 2013-02-19 LAB — URINALYSIS, ROUTINE W REFLEX MICROSCOPIC
Bilirubin Urine: NEGATIVE
Hgb urine dipstick: NEGATIVE
Ketones, ur: NEGATIVE
Urine Glucose: NEGATIVE
Urobilinogen, UA: 0.2 (ref 0.0–1.0)

## 2013-02-19 LAB — BASIC METABOLIC PANEL
Calcium: 9.1 mg/dL (ref 8.4–10.5)
Creatinine, Ser: 1 mg/dL (ref 0.4–1.2)
GFR: 80.15 mL/min (ref >60.00)
Glucose, Bld: 71 mg/dL (ref 70–99)
Sodium: 137 mEq/L (ref 135–145)

## 2013-02-19 LAB — LIPID PANEL
HDL: 80.9 mg/dL (ref >39.00)
VLDL: 13.4 mg/dL (ref 0.0–40.0)

## 2013-02-19 NOTE — Telephone Encounter (Signed)
Left msg on vm pt is down there for cpx labs. No order in comp. Placing cpx labs...lmb

## 2013-02-24 ENCOUNTER — Ambulatory Visit (INDEPENDENT_AMBULATORY_CARE_PROVIDER_SITE_OTHER): Payer: 59 | Admitting: Internal Medicine

## 2013-02-24 ENCOUNTER — Encounter: Payer: Self-pay | Admitting: Internal Medicine

## 2013-02-24 VITALS — BP 102/70 | HR 73 | Temp 97.0°F | Ht 62.25 in | Wt 122.4 lb

## 2013-02-24 DIAGNOSIS — Z0000 Encounter for general adult medical examination without abnormal findings: Secondary | ICD-10-CM

## 2013-02-24 NOTE — Patient Instructions (Signed)
It was good to see you today. We have reviewed your prior records including labs and tests today Health Maintenance reviewed - all recommended immunizations and age-appropriate screenings are up-to-date. Medications reviewed, no changes at this time. Please schedule followup in 1 year for your medical physical/labs, call sooner if problems. Health Maintenance, Females A healthy lifestyle and preventative care can promote health and wellness.  Maintain regular health, dental, and eye exams.   Eat a healthy diet. Foods like vegetables, fruits, whole grains, low-fat dairy products, and lean protein foods contain the nutrients you need without too many calories. Decrease your intake of foods high in solid fats, added sugars, and salt. Get information about a proper diet from your caregiver, if necessary.   Regular physical exercise is one of the most important things you can do for your health. Most adults should get at least 150 minutes of moderate-intensity exercise (any activity that increases your heart rate and causes you to sweat) each week. In addition, most adults need muscle-strengthening exercises on 2 or more days a week.     Maintain a healthy weight. The body mass index (BMI) is a screening tool to identify possible weight problems. It provides an estimate of body fat based on height and weight. Your caregiver can help determine your BMI, and can help you achieve or maintain a healthy weight. For adults 20 years and older:   A BMI below 18.5 is considered underweight.   A BMI of 18.5 to 24.9 is normal.   A BMI of 25 to 29.9 is considered overweight.   A BMI of 30 and above is considered obese.   Maintain normal blood lipids and cholesterol by exercising and minimizing your intake of saturated fat. Eat a balanced diet with plenty of fruits and vegetables. Blood tests for lipids and cholesterol should begin at age 17 and be repeated every 5 years. If your lipid or cholesterol levels  are high, you are over 50, or you are a high risk for heart disease, you may need your cholesterol levels checked more frequently. Ongoing high lipid and cholesterol levels should be treated with medicines if diet and exercise are not effective.   If you smoke, find out from your caregiver how to quit. If you do not use tobacco, do not start.   If you are pregnant, do not drink alcohol. If you are breastfeeding, be very cautious about drinking alcohol. If you are not pregnant and choose to drink alcohol, do not exceed 1 drink per day. One drink is considered to be 12 ounces (355 mL) of beer, 5 ounces (148 mL) of wine, or 1.5 ounces (44 mL) of liquor.   Avoid use of street drugs. Do not share needles with anyone. Ask for help if you need support or instructions about stopping the use of drugs.   High blood pressure causes heart disease and increases the risk of stroke. Blood pressure should be checked at least every 1 to 2 years. Ongoing high blood pressure should be treated with medicines, if weight loss and exercise are not effective.   If you are 2 to 39 years old, ask your caregiver if you should take aspirin to prevent strokes.   Diabetes screening involves taking a blood sample to check your fasting blood sugar level. This should be done once every 3 years, after age 19, if you are within normal weight and without risk factors for diabetes. Testing should be considered at a younger age or be carried out  more frequently if you are overweight and have at least 1 risk factor for diabetes.   Breast cancer screening is essential preventative care for women. You should practice "breast self-awareness." This means understanding the normal appearance and feel of your breasts and may include breast self-examination. Any changes detected, no matter how small, should be reported to a caregiver. Women in their 43s and 30s should have a clinical breast exam (CBE) by a caregiver as part of a regular health  exam every 1 to 3 years. After age 39, women should have a CBE every year. Starting at age 90, women should consider having a mammogram (breast X-ray) every year. Women who have a family history of breast cancer should talk to their caregiver about genetic screening. Women at a high risk of breast cancer should talk to their caregiver about having an MRI and a mammogram every year.   The Pap test is a screening test for cervical cancer. Women should have a Pap test starting at age 46. Between ages 57 and 33, Pap tests should be repeated every 2 years. Beginning at age 31, you should have a Pap test every 3 years as long as the past 3 Pap tests have been normal. If you had a hysterectomy for a problem that was not cancer or a condition that could lead to cancer, then you no longer need Pap tests. If you are between ages 47 and 54, and you have had normal Pap tests going back 10 years, you no longer need Pap tests. If you have had past treatment for cervical cancer or a condition that could lead to cancer, you need Pap tests and screening for cancer for at least 20 years after your treatment. If Pap tests have been discontinued, risk factors (such as a new sexual partner) need to be reassessed to determine if screening should be resumed. Some women have medical problems that increase the chance of getting cervical cancer. In these cases, your caregiver may recommend more frequent screening and Pap tests.   The human papillomavirus (HPV) test is an additional test that may be used for cervical cancer screening. The HPV test looks for the virus that can cause the cell changes on the cervix. The cells collected during the Pap test can be tested for HPV. The HPV test could be used to screen women aged 43 years and older, and should be used in women of any age who have unclear Pap test results. After the age of 42, women should have HPV testing at the same frequency as a Pap test.   Colorectal cancer can be detected  and often prevented. Most routine colorectal cancer screening begins at the age of 35 and continues through age 47. However, your caregiver may recommend screening at an earlier age if you have risk factors for colon cancer. On a yearly basis, your caregiver may provide home test kits to check for hidden blood in the stool. Use of a small camera at the end of a tube, to directly examine the colon (sigmoidoscopy or colonoscopy), can detect the earliest forms of colorectal cancer. Talk to your caregiver about this at age 40, when routine screening begins. Direct examination of the colon should be repeated every 5 to 10 years through age 71, unless early forms of pre-cancerous polyps or small growths are found.   Hepatitis C blood testing is recommended for all people born from [birth year] through 1965 and any individual with known risks for hepatitis C.   Practice  safe sex. Use condoms and avoid high-risk sexual practices to reduce the spread of sexually transmitted infections (STIs). Sexually active women aged 68 and younger should be checked for Chlamydia, which is a common sexually transmitted infection. Older women with new or multiple partners should also be tested for Chlamydia. Testing for other STIs is recommended if you are sexually active and at increased risk.   Osteoporosis is a disease in which the bones lose minerals and strength with aging. This can result in serious bone fractures. The risk of osteoporosis can be identified using a bone density scan. Women ages 55 and over and women at risk for fractures or osteoporosis should discuss screening with their caregivers. Ask your caregiver whether you should be taking a calcium supplement or vitamin D to reduce the rate of osteoporosis.   Menopause can be associated with physical symptoms and risks. Hormone replacement therapy is available to decrease symptoms and risks. You should talk to your caregiver about whether hormone replacement therapy is right  for you.   Use sunscreen with a sun protection factor (SPF) of 30 or greater. Apply sunscreen liberally and repeatedly throughout the day. You should seek shade when your shadow is shorter than you. Protect yourself by wearing long sleeves, pants, a wide-brimmed hat, and sunglasses year round, whenever you are outdoors.   Notify your caregiver of new moles or changes in moles, especially if there is a change in shape or color. Also notify your caregiver if a mole is larger than the size of a pencil eraser.   Stay current with your immunizations.  Document Released: 02/05/2011 Document Revised: 07/12/2011 Document Reviewed: 02/05/2011 [facility] Patient Information 2012 [place], [place].

## 2013-02-24 NOTE — Progress Notes (Signed)
  Subjective:    Patient ID: Darlene Davis, female    DOB: November 18, 1975, 37 y.o.   MRN: [id number]  HPI   patient is here today for annual physical. Patient feels well and has no complaints.  Past Medical History  Diagnosis Date  . 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 Hypertension Mother   . Hyperlipidemia Maternal Uncle   . Diabetes Maternal Grandmother    History  Substance Use Topics  . Smoking status: Never Smoker   . Smokeless tobacco: Not on file     Comment: Single-lives alone - [employer], work from home  . Alcohol Use: Not on file    Review of Systems  Constitutional: Negative for fever or weight change.  Respiratory: Negative for cough and shortness of breath.   Cardiovascular: Negative for chest pain or palpitations.  Gastrointestinal: Negative for abdominal pain, no bowel changes.  Musculoskeletal: Negative for gait problem or joint swelling.  Skin: Negative for rash.  Neurological: Negative for dizziness or headache.  No other specific complaints in a complete review of systems (except as listed in HPI above).     Objective:   Physical Exam  BP 102/70  Pulse 73  Temp(Src) 97 F (36.1 C) (Oral)  Ht 5' 2.25" (1.581 m)  Wt 122 lb 6.4 oz (55.52 kg)  BMI 22.21 kg/m2  SpO2 99% Wt Readings from Last 3 Encounters:  02/24/13 122 lb 6.4 oz (55.52 kg)  02/22/12 124 lb 3.2 oz (56.337 kg)  12/14/10 122 lb 12.8 oz (55.702 kg)   Constitutional: She appears well-developed and well-nourished. No distress.  HENT: Head: Normocephalic and atraumatic. Ears: B TMs ok, no erythema or effusion; Nose: Nose normal. Mouth/Throat: Oropharynx is clear and moist. No oropharyngeal exudate.  Eyes: Conjunctivae and EOM are normal. Pupils are equal, round, and reactive to light. No scleral icterus.  Neck: Normal range of motion. Neck supple. No JVD present. No thyromegaly present.  Cardiovascular: Normal rate, regular  rhythm and normal heart sounds.  No murmur heard. No BLE edema. Pulmonary/Chest: Effort normal and breath sounds normal. No respiratory distress. She has no wheezes.  Abdominal: Soft. Bowel sounds are normal. She exhibits no distension. There is no tenderness. no masses Musculoskeletal: Normal range of motion, no joint effusions. No gross deformities Neurological: She is alert and oriented to person, place, and time. No cranial nerve deficit. Coordination normal.  Skin: Skin is warm and dry. No rash noted. No erythema.  Psychiatric: She has a normal mood and affect. Her behavior is normal. Judgment and thought content normal.   Lab Results  Component Value Date   WBC 7.7 02/19/2013   HGB 14.4 02/19/2013   HCT 43.4 02/19/2013   PLT 433.0* 02/19/2013   GLUCOSE 71 02/19/2013   CHOL 183 02/19/2013   TRIG 67.0 02/19/2013   HDL 80.90 02/19/2013   LDLCALC 89 02/19/2013   ALT 23 02/19/2013   AST 21 02/19/2013   NA 137 02/19/2013   K 3.9 02/19/2013   CL 102 02/19/2013   CREATININE 1.0 02/19/2013   BUN 9 02/19/2013   CO2 30 02/19/2013   TSH 1.57 02/19/2013        Assessment & Plan:   CPX/v70.0 - Patient has been counseled on age-appropriate routine health concerns for screening and prevention. These are reviewed and up-to-date. Immunizations are up-to-date or declined. Labs reviewed.

## 2013-05-18 ENCOUNTER — Encounter (HOSPITAL_COMMUNITY): Payer: Self-pay | Admitting: Emergency Medicine

## 2013-05-18 ENCOUNTER — Emergency Department (HOSPITAL_COMMUNITY)
Admission: EM | Admit: 2013-05-18 | Discharge: 2013-05-18 | Disposition: A | Discharge status: Home / Self Care | Payer: 59

## 2013-05-18 LAB — CBC WITH DIFFERENTIAL/PLATELET
Basophils Absolute: 0 10*3/uL (ref 0.0–0.1)
Basophils Relative: 0 % (ref 0–1)
Eosinophils Absolute: 0.1 10*3/uL (ref 0.0–0.7)
Eosinophils Relative: 2 % (ref 0–5)
Hemoglobin: 12.9 g/dL (ref 12.0–15.0)
MCH: 28.9 pg (ref 26.0–34.0)
MCHC: 34.3 g/dL (ref 30.0–36.0)
MCV: 84.1 fL (ref 78.0–100.0)
Monocytes Relative: 7 % (ref 3–12)
Neutrophils Relative %: 56 % (ref 43–77)

## 2013-05-18 LAB — COMPREHENSIVE METABOLIC PANEL
ALT: 17 U/L (ref 0–35)
Albumin: 3.7 g/dL (ref 3.5–5.2)
Alkaline Phosphatase: 81 U/L (ref 39–117)
BUN: 10 mg/dL (ref 6–23)
Calcium: 9.3 mg/dL (ref 8.4–10.5)
Creatinine, Ser: 0.96 mg/dL (ref 0.50–1.10)
GFR calc Af Amer: 85 mL/min — ABNORMAL LOW (ref >90)
GFR calc non Af Amer: 74 mL/min — ABNORMAL LOW (ref >90)
Glucose, Bld: 105 mg/dL — ABNORMAL HIGH (ref 70–99)
Potassium: 3.5 mEq/L (ref 3.5–5.1)
Total Protein: 7.1 g/dL (ref 6.0–8.3)

## 2013-05-18 NOTE — ED Notes (Signed)
Pt was registered under the wrong name .

## 2013-05-18 NOTE — ED Notes (Signed)
Pt here with c/o sob that started this am pt is also c/o stiff neck and back , pt very anxious in triage sats 99% on room air

## 2013-05-27 ENCOUNTER — Ambulatory Visit (INDEPENDENT_AMBULATORY_CARE_PROVIDER_SITE_OTHER): Payer: 59

## 2013-05-27 DIAGNOSIS — Z23 Encounter for immunization: Secondary | ICD-10-CM

## 2013-08-25 ENCOUNTER — Ambulatory Visit (HOSPITAL_BASED_OUTPATIENT_CLINIC_OR_DEPARTMENT_OTHER)
Admission: RE | Admit: 2013-08-25 | Discharge: 2013-08-25 | Disposition: A | Discharge status: Home / Self Care | Payer: 59 | Source: Ambulatory Visit | Attending: Internal Medicine | Admitting: Internal Medicine

## 2013-08-25 ENCOUNTER — Ambulatory Visit (INDEPENDENT_AMBULATORY_CARE_PROVIDER_SITE_OTHER): Payer: 59 | Admitting: Internal Medicine

## 2013-08-25 ENCOUNTER — Telehealth: Payer: Self-pay | Admitting: Internal Medicine

## 2013-08-25 ENCOUNTER — Encounter: Payer: Self-pay | Admitting: Internal Medicine

## 2013-08-25 VITALS — BP 119/77 | HR 115 | Temp 99.5°F | Wt 128.4 lb

## 2013-08-25 DIAGNOSIS — R509 Fever, unspecified: Secondary | ICD-10-CM

## 2013-08-25 DIAGNOSIS — B9789 Other viral agents as the cause of diseases classified elsewhere: Secondary | ICD-10-CM

## 2013-08-25 DIAGNOSIS — B349 Viral infection, unspecified: Secondary | ICD-10-CM

## 2013-08-25 DIAGNOSIS — R05 Cough: Secondary | ICD-10-CM | POA: Insufficient documentation

## 2013-08-25 DIAGNOSIS — R059 Cough, unspecified: Secondary | ICD-10-CM | POA: Insufficient documentation

## 2013-08-25 LAB — POCT INFLUENZA A/B
Influenza A, POC: NEGATIVE
Influenza B, POC: NEGATIVE

## 2013-08-25 LAB — POCT RAPID STREP A (OFFICE): Rapid Strep A Screen: NEGATIVE

## 2013-08-25 MED ORDER — OSELTAMIVIR PHOSPHATE 75 MG PO CAPS: 75.0000 mg | ORAL_CAPSULE | Freq: Two times a day (BID) | ORAL | Status: DC

## 2013-08-25 NOTE — Progress Notes (Signed)
Pre visit review using our clinic review tool, if applicable. No additional management support is needed unless otherwise documented below in the visit note. 

## 2013-08-25 NOTE — Telephone Encounter (Signed)
Patient wants to cancel request below. Please disregard.

## 2013-08-25 NOTE — Telephone Encounter (Signed)
Patient states Dr. Larose Kells wrote rx for tamiflu and it is too expensive. She would like to know if there is an alternative. Pt uses [facility].

## 2013-08-25 NOTE — Telephone Encounter (Signed)
Patient says she will pick up Tamiflu.     KP

## 2013-08-25 NOTE — Progress Notes (Signed)
   Subjective:    Patient ID: Darlene Davis, female    DOB: Apr 12, 1976, 38 y.o.   MRN: [id number]  HPI Acute visit, here with a friend Last night started to feel a little "congested at the throat ", this morning she woke up with chills, "chest congestion ", sore throat. In the office she was noted to be febrile. Has not taken any medication  Past Medical History  Diagnosis Date  . 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 No past surgeries     History   Social History  . Marital Status: Married    Spouse Name: N/A    Number of Children: N/A  . Years of Education: N/A   Occupational History  . Not on file.   Social History Main Topics  . Smoking status: Never Smoker   . Smokeless tobacco: Not on file     Comment: Single-lives alone - [employer], work from home  . Alcohol Use: Not on file  . Drug Use: Not on file  . Sexual Activity: Not on file   Other Topics Concern  . Not on file   Social History Narrative  . No narrative on file     Review of Systems Denies myalgias. Mild nausea since yesterday, no vomiting or diarrhea. Mild mid abdominal cramps. Denies dysuria or gross hematuria. Minimal if any cough. 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 TMs normal, throat symmetric, no redness or discharge. Face symmetric, sinuses not tender to palpation. Nose slt  congested.  Lungs -- normal respiratory effort, no intercostal retractions, no accessory muscle use, and normal breath sounds.  Heart-- normal rate, regular rhythm, no murmur.  Abdomen-- Not distended, good bowel sounds,soft, non-tender. Extremities-- no pretibial edema  bilaterally  Neurologic--  alert & oriented X3. Speech normal Psych-- Cognition and judgment appear intact. Cooperative with normal attention span and concentration. No anxious or depressed appearing.      Assessment & Plan:  Viral syndrome Strep a and Flu test neg Very likely she has influenza or similar  virus, for completeness we'll get a chest x-ray. Start Tamiflu.

## 2013-08-25 NOTE — Patient Instructions (Signed)
Get the XR at [facility], corner of [address] and [address] (10 minutes form here); they are open 24/7 [place], [place] [address] [phone]  Rest, fluids For fever take tylenol or Motrin 200 mg 2 tablets every 6 hours as needed for pain. Always take it with food because may cause gastritis and ulcers. If you notice nausea, stomach pain, change in the color of stools --->  Stop the medicine and let us know  If cough, take Mucinex DM twice a day as needed   If nasal  congestion use OTC Nasocort: 2 nasal sprays on each side of the nose daily until you feel better   Take  tamiflu  as prescribed    Call if no better in few days Call anytime if the symptoms are severe or  high fever, short of breath, chest pain, severe headache, a rash

## 2013-11-04 LAB — HM MAMMOGRAPHY

## 2014-02-22 ENCOUNTER — Other Ambulatory Visit: Payer: 59

## 2014-02-25 ENCOUNTER — Encounter: Payer: Self-pay | Admitting: Internal Medicine

## 2014-02-25 ENCOUNTER — Ambulatory Visit (INDEPENDENT_AMBULATORY_CARE_PROVIDER_SITE_OTHER): Payer: 59 | Admitting: Internal Medicine

## 2014-02-25 VITALS — BP 120/70 | HR 76 | Temp 98.3°F | Ht 62.25 in | Wt 125.0 lb

## 2014-02-25 DIAGNOSIS — Z0000 Encounter for general adult medical examination without abnormal findings: Secondary | ICD-10-CM

## 2014-02-25 NOTE — Progress Notes (Signed)
Subjective:    Patient ID: Darlene Davis, female    DOB: 1975/11/09, 38 y.o.   MRN: [id number]  HPI  patient is here today for annual physical. Patient feels well and has no complaints.  Also reviewed chronic medical issues and interval medical events  Past Medical History  Diagnosis Date  . 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 Hypertension Mother     hx of, off meds  . Hyperlipidemia Maternal Uncle   . Diabetes Maternal Grandmother   . Hypertension Brother   . Colon cancer Cousin 40    liver, hx EtOH  . Colon cancer Paternal Uncle 66   History  Substance Use Topics  . Smoking status: Never Smoker   . Smokeless tobacco: Never Used     Comment: Single-lives alone - [employer], work from home  . Alcohol Use: Yes     Comment: rarely     Review of Systems  Constitutional: Positive for fatigue. Negative for unexpected weight change.  Respiratory: Negative for cough, shortness of breath and wheezing.   Cardiovascular: Negative for chest pain, palpitations and leg swelling.  Gastrointestinal: Negative for nausea, abdominal pain and diarrhea.  Neurological: Positive for light-headedness (on occassion with heat exposure, outdoors in summer). Negative for dizziness, tremors, seizures, syncope, weakness, numbness and headaches.  Psychiatric/Behavioral: Negative for dysphoric mood. The patient is not nervous/anxious.   All other systems reviewed and are negative.      Objective:   Physical Exam  BP 120/70  Pulse 76  Temp(Src) 98.3 F (36.8 C) (Oral)  Ht 5' 2.25" (1.581 m)  Wt 125 lb (56.7 kg)  BMI 22.68 kg/m2  SpO2 99% Wt Readings from Last 3 Encounters:  02/25/14 125 lb (56.7 kg)  08/25/13 128 lb 6.4 oz (58.242 kg)  02/24/13 122 lb 6.4 oz (55.52 kg)   Constitutional: She appears well-developed and well-nourished. No distress.  HENT: Head: Normocephalic and atraumatic. Ears: B TMs ok, no erythema or  effusion; Nose: Nose normal. Mouth/Throat: Oropharynx is clear and moist. No oropharyngeal exudate.  Eyes: Conjunctivae and EOM are normal. Pupils are equal, round, and reactive to light. No scleral icterus.  Neck: Normal range of motion. Neck supple. No JVD present. No thyromegaly present.  Cardiovascular: Normal rate, regular rhythm and normal heart sounds.  No murmur heard. No BLE edema. Pulmonary/Chest: Effort normal and breath sounds normal. No respiratory distress. She has no wheezes.  Abdominal: Soft. Bowel sounds are normal. She exhibits no distension. There is no tenderness. no masses GU/breast: defer to gyn Musculoskeletal: Normal range of motion, no joint effusions. No gross deformities Neurological: She is alert and oriented to person, place, and time. No cranial nerve deficit. Coordination, balance, strength, speech and gait are normal.  Skin: Skin is warm and dry. No rash noted. No erythema.  Psychiatric: She has a normal mood and affect. Her behavior is normal. Judgment and thought content normal.    Lab Results  Component Value Date   WBC 6.1 05/18/2013   HGB 12.9 05/18/2013   HCT 37.6 05/18/2013   PLT 344 05/18/2013   GLUCOSE 105* 05/18/2013   CHOL 183 02/19/2013   TRIG 67.0 02/19/2013   HDL 80.90 02/19/2013   LDLCALC 89 02/19/2013   ALT 17 05/18/2013   AST 17 05/18/2013   NA 134* 05/18/2013   K 3.5 05/18/2013   CL 99 05/18/2013   CREATININE 0.96 05/18/2013  BUN 10 05/18/2013   CO2 20 05/18/2013   TSH 1.57 02/19/2013    Dg Chest 2 View  08/25/2013   CLINICAL DATA:  Cough and fever.  Question pneumonia.  EXAM: CHEST  2 VIEW  COMPARISON:  None.  FINDINGS: The lungs are clear. There is no pneumothorax or pleural effusion. Heart size is normal. No focal bony abnormality is identified.  IMPRESSION: Negative chest.   Electronically Signed   By: Inge Rise M.D.   On: 08/25/2013 15:04       Assessment & Plan:   CPX/v70.0 - Patient has been counseled on  age-appropriate routine health concerns for screening and prevention. These are reviewed and up-to-date. Immunizations are up-to-date or declined. Labs ordered and reviewed.

## 2014-02-25 NOTE — Progress Notes (Signed)
Pre visit review using our clinic review tool, if applicable. No additional management support is needed unless otherwise documented below in the visit note. 

## 2014-02-25 NOTE — Patient Instructions (Addendum)
It was good to see you today.  We have reviewed your prior records including labs and tests today  Health Maintenance reviewed - all recommended immunizations and age-appropriate screenings are up-to-date.  Test(s) ordered today. Your results will be released to MyChart (or called to you) after review, usually within 72hours after test completion. If any changes need to be made, you will be notified at that same time.  Medications reviewed and updated, no changes recommended at this time.  Please schedule followup in 12 months for annual exam and labs, call sooner if problems.  Health Maintenance Adopting a healthy lifestyle and getting preventive care can go a long way to promote health and wellness. Talk with your health care provider about what schedule of regular examinations is right for you. This is a good chance for you to check in with your provider about disease prevention and staying healthy. In between checkups, there are plenty of things you can do on your own. Experts have done a lot of research about which lifestyle changes and preventive measures are most likely to keep you healthy. Ask your health care provider for more information. WEIGHT AND DIET  Eat a healthy diet  Be sure to include plenty of vegetables, fruits, low-fat dairy products, and lean protein.  Do not eat a lot of foods high in solid fats, added sugars, or salt.  Get regular exercise. This is one of the most important things you can do for your health.  Most adults should exercise for at least 150 minutes each week. The exercise should increase your heart rate and make you sweat (moderate-intensity exercise).  Most adults should also do strengthening exercises at least twice a week. This is in addition to the moderate-intensity exercise.  Maintain a healthy weight  Body mass index (BMI) is a measurement that can be used to identify possible weight problems. It estimates body fat based on height and weight.  Your health care provider can help determine your BMI and help you achieve or maintain a healthy weight.  For females 20 years of age and older:   A BMI below 18.5 is considered underweight.  A BMI of 18.5 to 24.9 is normal.  A BMI of 25 to 29.9 is considered overweight.  A BMI of 30 and above is considered obese.  Watch levels of cholesterol and blood lipids  You should start having your blood tested for lipids and cholesterol at 40 years of age, then have this test every 5 years.  You may need to have your cholesterol levels checked more often if:  Your lipid or cholesterol levels are high.  You are older than 40 years of age.  You are at high risk for heart disease.  CANCER SCREENING   Lung Cancer  Lung cancer screening is recommended for adults 55-80 years old who are at high risk for lung cancer because of a history of smoking.  A yearly low-dose CT scan of the lungs is recommended for people who:  Currently smoke.  Have quit within the past 15 years.  Have at least a 30-pack-year history of smoking. A pack year is smoking an average of one pack of cigarettes a day for 1 year.  Yearly screening should continue until it has been 15 years since you quit.  Yearly screening should stop if you develop a health problem that would prevent you from having lung cancer treatment.  Breast Cancer  Practice breast self-awareness. This means understanding how your breasts normally appear and   feel.  It also means doing regular breast self-exams. Let your health care provider know about any changes, no matter how small.  If you are in your 20s or 30s, you should have a clinical breast exam (CBE) by a health care provider every 1-3 years as part of a regular health exam.  If you are 40 or older, have a CBE every year. Also consider having a breast X-ray (mammogram) every year.  If you have a family history of breast cancer, talk to your health care provider about genetic  screening.  If you are at high risk for breast cancer, talk to your health care provider about having an MRI and a mammogram every year.  Breast cancer gene (BRCA) assessment is recommended for women who have family members with BRCA-related cancers. BRCA-related cancers include:  Breast.  Ovarian.  Tubal.  Peritoneal cancers.  Results of the assessment will determine the need for genetic counseling and BRCA1 and BRCA2 testing. Cervical Cancer Routine pelvic examinations to screen for cervical cancer are no longer recommended for nonpregnant women who are considered low risk for cancer of the pelvic organs (ovaries, uterus, and vagina) and who do not have symptoms. A pelvic examination may be necessary if you have symptoms including those associated with pelvic infections. Ask your health care provider if a screening pelvic exam is right for you.   The Pap test is the screening test for cervical cancer for women who are considered at risk.  If you had a hysterectomy for a problem that was not cancer or a condition that could lead to cancer, then you no longer need Pap tests.  If you are older than 65 years, and you have had normal Pap tests for the past 10 years, you no longer need to have Pap tests.  If you have had past treatment for cervical cancer or a condition that could lead to cancer, you need Pap tests and screening for cancer for at least 20 years after your treatment.  If you no longer get a Pap test, assess your risk factors if they change (such as having a new sexual partner). This can affect whether you should start being screened again.  Some women have medical problems that increase their chance of getting cervical cancer. If this is the case for you, your health care provider may recommend more frequent screening and Pap tests.  The human papillomavirus (HPV) test is another test that may be used for cervical cancer screening. The HPV test looks for the virus that can  cause cell changes in the cervix. The cells collected during the Pap test can be tested for HPV.  The HPV test can be used to screen women 30 years of age and older. Getting tested for HPV can extend the interval between normal Pap tests from three to five years.  An HPV test also should be used to screen women of any age who have unclear Pap test results.  After 40 years of age, women should have HPV testing as often as Pap tests.  Colorectal Cancer  This type of cancer can be detected and often prevented.  Routine colorectal cancer screening usually begins at 40 years of age and continues through 40 years of age.  Your health care provider may recommend screening at an earlier age if you have risk factors for colon cancer.  Your health care provider may also recommend using home test kits to check for hidden blood in the stool.  A small camera   at the end of a tube can be used to examine your colon directly (sigmoidoscopy or colonoscopy). This is done to check for the earliest forms of colorectal cancer.  Routine screening usually begins at age 50.  Direct examination of the colon should be repeated every 5-10 years through 40 years of age. However, you may need to be screened more often if early forms of precancerous polyps or small growths are found. Skin Cancer  Check your skin from head to toe regularly.  Tell your health care provider about any new moles or changes in moles, especially if there is a change in a mole's shape or color.  Also tell your health care provider if you have a mole that is larger than the size of a pencil eraser.  Always use sunscreen. Apply sunscreen liberally and repeatedly throughout the day.  Protect yourself by wearing long sleeves, pants, a wide-brimmed hat, and sunglasses whenever you are outside. HEART DISEASE, DIABETES, AND HIGH BLOOD PRESSURE   Have your blood pressure checked at least every 1-2 years. High blood pressure causes heart  disease and increases the risk of stroke.  If you are between 55 years and 79 years old, ask your health care provider if you should take aspirin to prevent strokes.  Have regular diabetes screenings. This involves taking a blood sample to check your fasting blood sugar level.  If you are at a normal weight and have a low risk for diabetes, have this test once every three years after 40 years of age.  If you are overweight and have a high risk for diabetes, consider being tested at a younger age or more often. PREVENTING INFECTION  Hepatitis B  If you have a higher risk for hepatitis B, you should be screened for this virus. You are considered at high risk for hepatitis B if:  You were born in a country where hepatitis B is common. Ask your health care provider which countries are considered high risk.  Your parents were born in a high-risk country, and you have not been immunized against hepatitis B (hepatitis B vaccine).  You have HIV or AIDS.  You use needles to inject street drugs.  You live with someone who has hepatitis B.  You have had sex with someone who has hepatitis B.  You get hemodialysis treatment.  You take certain medicines for conditions, including cancer, organ transplantation, and autoimmune conditions. Hepatitis C  Blood testing is recommended for:  Everyone born from 1945 through 1965.  Anyone with known risk factors for hepatitis C. Sexually transmitted infections (STIs)  You should be screened for sexually transmitted infections (STIs) including gonorrhea and chlamydia if:  You are sexually active and are younger than 40 years of age.  You are older than 40 years of age and your health care provider tells you that you are at risk for this type of infection.  Your sexual activity has changed since you were last screened and you are at an increased risk for chlamydia or gonorrhea. Ask your health care provider if you are at risk.  If you do not have  HIV, but are at risk, it may be recommended that you take a prescription medicine daily to prevent HIV infection. This is called pre-exposure prophylaxis (PrEP). You are considered at risk if:  You are sexually active and do not regularly use condoms or know the HIV status of your partner(s).  You take drugs by injection.  You are sexually active with a partner   who has HIV. Talk with your health care provider about whether you are at high risk of being infected with HIV. If you choose to begin PrEP, you should first be tested for HIV. You should then be tested every 3 months for as long as you are taking PrEP.  PREGNANCY   If you are premenopausal and you may become pregnant, ask your health care provider about preconception counseling.  If you may become pregnant, take 400 to 800 micrograms (mcg) of folic acid every day.  If you want to prevent pregnancy, talk to your health care provider about birth control (contraception). OSTEOPOROSIS AND MENOPAUSE   Osteoporosis is a disease in which the bones lose minerals and strength with aging. This can result in serious bone fractures. Your risk for osteoporosis can be identified using a bone density scan.  If you are 65 years of age or older, or if you are at risk for osteoporosis and fractures, ask your health care provider if you should be screened.  Ask your health care provider whether you should take a calcium or vitamin D supplement to lower your risk for osteoporosis.  Menopause may have certain physical symptoms and risks.  Hormone replacement therapy may reduce some of these symptoms and risks. Talk to your health care provider about whether hormone replacement therapy is right for you.  HOME CARE INSTRUCTIONS   Schedule regular health, dental, and eye exams.  Stay current with your immunizations.   Do not use any tobacco products including cigarettes, chewing tobacco, or electronic cigarettes.  If you are pregnant, do not  drink alcohol.  If you are breastfeeding, limit how much and how often you drink alcohol.  Limit alcohol intake to no more than 1 drink per day for nonpregnant women. One drink equals 12 ounces of beer, 5 ounces of wine, or 1 ounces of hard liquor.  Do not use street drugs.  Do not share needles.  Ask your health care provider for help if you need support or information about quitting drugs.  Tell your health care provider if you often feel depressed.  Tell your health care provider if you have ever been abused or do not feel safe at home. Document Released: 02/05/2011 Document Revised: 12/07/2013 Document Reviewed: 06/24/2013 [employer] Patient Information 2015 [employer]. This information is not intended to replace advice given to you by your health care provider. Make sure you discuss any questions you have with your health care provider.  

## 2014-02-26 ENCOUNTER — Other Ambulatory Visit (INDEPENDENT_AMBULATORY_CARE_PROVIDER_SITE_OTHER): Payer: 59

## 2014-02-26 DIAGNOSIS — Z0000 Encounter for general adult medical examination without abnormal findings: Secondary | ICD-10-CM

## 2014-02-26 LAB — LIPID PANEL
CHOLESTEROL: 204 mg/dL — AB (ref 0–200)
HDL: 76.8 mg/dL (ref >39.00)
LDL CALC: 110 mg/dL — AB (ref 0–99)
NonHDL: 127.2
TRIGLYCERIDES: 87 mg/dL (ref 0.0–149.0)
Total CHOL/HDL Ratio: 3
VLDL: 17.4 mg/dL (ref 0.0–40.0)

## 2014-02-26 LAB — CBC WITH DIFFERENTIAL/PLATELET
Basophils Absolute: 0 10*3/uL (ref 0.0–0.1)
Basophils Relative: 0.6 % (ref 0.0–3.0)
EOS PCT: 2.4 % (ref 0.0–5.0)
Eosinophils Absolute: 0.2 10*3/uL (ref 0.0–0.7)
HEMATOCRIT: 44.3 % (ref 36.0–46.0)
Hemoglobin: 14.8 g/dL (ref 12.0–15.0)
LYMPHS ABS: 2.7 10*3/uL (ref 0.7–4.0)
Lymphocytes Relative: 36.8 % (ref 12.0–46.0)
MCHC: 33.5 g/dL (ref 30.0–36.0)
MCV: 89.9 fl (ref 78.0–100.0)
MONO ABS: 0.8 10*3/uL (ref 0.1–1.0)
Monocytes Relative: 10.4 % (ref 3.0–12.0)
Neutro Abs: 3.7 10*3/uL (ref 1.4–7.7)
Neutrophils Relative %: 49.8 % (ref 43.0–77.0)
Platelets: 425 10*3/uL — ABNORMAL HIGH (ref 150.0–400.0)
RBC: 4.92 Mil/uL (ref 3.87–5.11)
RDW: 14.1 % (ref 11.5–15.5)
WBC: 7.3 10*3/uL (ref 4.0–10.5)

## 2014-02-26 LAB — HEPATIC FUNCTION PANEL
ALT: 21 U/L (ref 0–35)
AST: 21 U/L (ref 0–37)
Albumin: 3.8 g/dL (ref 3.5–5.2)
Alkaline Phosphatase: 45 U/L (ref 39–117)
Bilirubin, Direct: 0.2 mg/dL (ref 0.0–0.3)
Total Bilirubin: 1.9 mg/dL — ABNORMAL HIGH (ref 0.2–1.2)
Total Protein: 7.1 g/dL (ref 6.0–8.3)

## 2014-02-26 LAB — URINALYSIS, ROUTINE W REFLEX MICROSCOPIC
BILIRUBIN URINE: NEGATIVE
Hgb urine dipstick: NEGATIVE
Ketones, ur: NEGATIVE
Leukocytes, UA: NEGATIVE
Nitrite: NEGATIVE
PH: 7 (ref 5.0–8.0)
SPECIFIC GRAVITY, URINE: 1.01 (ref 1.000–1.030)
TOTAL PROTEIN, URINE-UPE24: NEGATIVE
Urine Glucose: NEGATIVE
Urobilinogen, UA: 0.2 (ref 0.0–1.0)
WBC, UA: NONE SEEN (ref 0–?)

## 2014-02-26 LAB — TSH: TSH: 0.33 u[IU]/mL — ABNORMAL LOW (ref 0.35–4.50)

## 2014-02-26 LAB — BASIC METABOLIC PANEL
BUN: 9 mg/dL (ref 6–23)
CHLORIDE: 101 meq/L (ref 96–112)
CO2: 31 meq/L (ref 19–32)
Calcium: 9.1 mg/dL (ref 8.4–10.5)
Creatinine, Ser: 0.9 mg/dL (ref 0.4–1.2)
GFR: 92.56 mL/min (ref >60.00)
Glucose, Bld: 74 mg/dL (ref 70–99)
POTASSIUM: 4.1 meq/L (ref 3.5–5.1)
Sodium: 139 mEq/L (ref 135–145)

## 2014-02-26 LAB — FERRITIN: FERRITIN: 97.6 ng/mL (ref 10.0–291.0)

## 2014-05-21 ENCOUNTER — Ambulatory Visit: Payer: 59

## 2014-05-21 ENCOUNTER — Ambulatory Visit (INDEPENDENT_AMBULATORY_CARE_PROVIDER_SITE_OTHER): Payer: 59

## 2014-05-21 DIAGNOSIS — Z23 Encounter for immunization: Secondary | ICD-10-CM

## 2015-04-07 ENCOUNTER — Encounter: Payer: Self-pay | Admitting: Internal Medicine

## 2015-04-07 ENCOUNTER — Ambulatory Visit (INDEPENDENT_AMBULATORY_CARE_PROVIDER_SITE_OTHER): Payer: 59 | Admitting: Internal Medicine

## 2015-04-07 ENCOUNTER — Other Ambulatory Visit (INDEPENDENT_AMBULATORY_CARE_PROVIDER_SITE_OTHER): Payer: 59

## 2015-04-07 VITALS — BP 142/88 | HR 112 | Temp 98.1°F | Resp 16 | Wt 122.0 lb

## 2015-04-07 DIAGNOSIS — R000 Tachycardia, unspecified: Secondary | ICD-10-CM | POA: Diagnosis not present

## 2015-04-07 DIAGNOSIS — G479 Sleep disorder, unspecified: Secondary | ICD-10-CM | POA: Diagnosis not present

## 2015-04-07 DIAGNOSIS — R630 Anorexia: Secondary | ICD-10-CM | POA: Diagnosis not present

## 2015-04-07 LAB — CBC WITH DIFFERENTIAL/PLATELET
BASOS ABS: 0.1 10*3/uL (ref 0.0–0.1)
BASOS PCT: 0.9 % (ref 0.0–3.0)
Eosinophils Absolute: 0.1 10*3/uL (ref 0.0–0.7)
Eosinophils Relative: 1.2 % (ref 0.0–5.0)
HEMATOCRIT: 45.5 % (ref 36.0–46.0)
Hemoglobin: 15.4 g/dL — ABNORMAL HIGH (ref 12.0–15.0)
LYMPHS ABS: 2.4 10*3/uL (ref 0.7–4.0)
LYMPHS PCT: 26.9 % (ref 12.0–46.0)
MCHC: 33.8 g/dL (ref 30.0–36.0)
MCV: 88.3 fl (ref 78.0–100.0)
MONOS PCT: 7.2 % (ref 3.0–12.0)
Monocytes Absolute: 0.7 10*3/uL (ref 0.1–1.0)
NEUTROS PCT: 63.8 % (ref 43.0–77.0)
Neutro Abs: 5.8 10*3/uL (ref 1.4–7.7)
Platelets: 514 10*3/uL — ABNORMAL HIGH (ref 150.0–400.0)
RBC: 5.16 Mil/uL — ABNORMAL HIGH (ref 3.87–5.11)
RDW: 14 % (ref 11.5–15.5)
WBC: 9.1 10*3/uL (ref 4.0–10.5)

## 2015-04-07 LAB — T4, FREE: FREE T4: 0.96 ng/dL (ref 0.60–1.60)

## 2015-04-07 LAB — BASIC METABOLIC PANEL
BUN: 10 mg/dL (ref 6–23)
CHLORIDE: 101 meq/L (ref 96–112)
CO2: 30 meq/L (ref 19–32)
Calcium: 10.3 mg/dL (ref 8.4–10.5)
Creatinine, Ser: 1.13 mg/dL (ref 0.40–1.20)
GFR: 68.07 mL/min (ref >60.00)
GLUCOSE: 89 mg/dL (ref 70–99)
Potassium: 4.1 mEq/L (ref 3.5–5.1)
SODIUM: 140 meq/L (ref 135–145)

## 2015-04-07 LAB — TSH: TSH: 1.35 u[IU]/mL (ref 0.35–4.50)

## 2015-04-07 LAB — HEPATIC FUNCTION PANEL
ALT: 21 U/L (ref 0–35)
AST: 21 U/L (ref 0–37)
Albumin: 4.4 g/dL (ref 3.5–5.2)
Alkaline Phosphatase: 55 U/L (ref 39–117)
Bilirubin, Direct: 0.2 mg/dL (ref 0.0–0.3)
TOTAL PROTEIN: 7.9 g/dL (ref 6.0–8.3)
Total Bilirubin: 1 mg/dL (ref 0.2–1.2)

## 2015-04-07 LAB — T3, FREE: T3 FREE: 3.7 pg/mL (ref 2.3–4.2)

## 2015-04-07 MED ORDER — CITALOPRAM HYDROBROMIDE 20 MG PO TABS: 20.0000 mg | ORAL_TABLET | Freq: Every day | ORAL | Status: DC

## 2015-04-07 MED ORDER — CLONAZEPAM 0.5 MG PO TABS: ORAL_TABLET | ORAL | Status: DC

## 2015-04-07 NOTE — Progress Notes (Signed)
   Subjective:    Patient ID: Darlene Davis, female    DOB: 01-12-1976, 39 y.o.   MRN: [id number]  HPI  Her symptoms began 3 weeks ago as some loss of appetite and sleep disturbance. She states she was having difficulty going to sleep and staying asleep. This was in the context of job-related stress. She was having difficulty focusing and questioned panic attacks.  In the last 1.5 weeks she's had a "knot" in her stomach and has been able to eat only toast & chicken soup. She has lost 4 pounds in the last 3 weeks. She does note intermittent "speeding up of her heart" but has no other cardio pulmonary symptoms.  She was working out multiple times a week but has decreased this to once a week because of her work demands. She works from home and states: "I can get away from it"  She definitely denies any constellation of headache, chest pain, flushing, & diarrhea. She is unaware of any paroxysmal hypertension.  Family history is negative for mental health processes. Specifically she denies family history of anxiety, depression, bipolar disorder, substance abuse, or suicide.  Review of Systems  She denies edema or calf pain. She has no paroxysmal nocturnal dyspnea. She denies blurred vision, double vision, loss of vision.  She's had no significant change in her hair, skin, nails  There's been no persistent change in bowel such as diarrhea. She denies any melena or rectal bleeding. There's been no significant hoarseness and no dysphagia..     Objective:   Physical Exam Pertinent or positive findings include: She appears younger than her stated age. She is anxious and intermittently tearful. Thyroid is small.Pedal pulses are decreased.  General appearance : Thin but adequately nourished; in no distress.  Eyes: No conjunctival inflammation or scleral icterus is present.  Oral exam:  Lips and gums are healthy appearing.There is no oropharyngeal erythema or exudate noted. Dental hygiene is  good.  Heart:  Normal rate and regular rhythm. S1 and S2 normal without gallop, murmur, click, rub or other extra sounds    Lungs:Chest clear to auscultation; no wheezes, rhonchi,rales ,or rubs present.No increased work of breathing.   Abdomen: bowel sounds normal, soft and non-tender without masses, organomegaly or hernias noted.  No guarding or rebound. No flank tenderness to percussion.  Vascular : all pulses equal ; no bruits present.  Skin:Warm & dry.  Intact without suspicious lesions or rashes ; no tenting or jaundice   Lymphatic: No lymphadenopathy is noted about the head, neck, axilla, or inguinal areas.   Neuro: Strength, tone & DTRs normal.     Assessment & Plan:  #1 tachycardia  #2 sleep disorder  #3 anorexia with weight loss  Plan: See orders. The pathophysiology of neurotransmitter deficiency was discussed along with the benefits and potential adverse effects of SSRI therapy.

## 2015-04-07 NOTE — Progress Notes (Signed)
Pre visit review using our clinic review tool, if applicable. No additional management support is needed unless otherwise documented below in the visit note. 

## 2015-04-07 NOTE — Patient Instructions (Signed)
  Your next office appointment will be determined based upon review of your pending labs  and  xrays  Those written interpretation of the lab results and instructions will be transmitted to you by My Chart  Critical results will be called.   Followup as needed for any active or acute issue. Please report any significant change in your symptoms.  Please consider taking the agent to raise the neurotransmitters which are essential for good brain function, both intellectual & emotional health if labs are negative or normal. These agents are not addictive and simply keep this essential neurotransmitter at therapeutic levels. If these levels become severely depleted; depression or panic attacks can occur.

## 2015-04-08 ENCOUNTER — Encounter: Payer: Self-pay | Admitting: Internal Medicine

## 2015-04-13 NOTE — Telephone Encounter (Signed)
Please advise 

## 2015-04-22 ENCOUNTER — Ambulatory Visit: Payer: 59

## 2015-08-04 ENCOUNTER — Telehealth: Payer: Self-pay

## 2015-08-04 ENCOUNTER — Telehealth: Payer: Self-pay | Admitting: Internal Medicine

## 2015-08-04 NOTE — Telephone Encounter (Signed)
Left message asking patient to call back to schedule nurse visit for flu vaccine and to make appt with new PCP to get established

## 2015-08-04 NOTE — Telephone Encounter (Signed)
Flu was done a [facility] [address] back in sept 16th ish

## 2015-08-10 NOTE — Telephone Encounter (Signed)
Flu vaccine has been documented in chart.

## 2015-09-04 IMAGING — CR DG CHEST 2V
2 series · 2 of 2 positions shown · non-contrast
Comparison: None.

CLINICAL DATA: Cough and fever.  Question pneumonia.

EXAM:
CHEST  2 VIEW

[w chest pa]
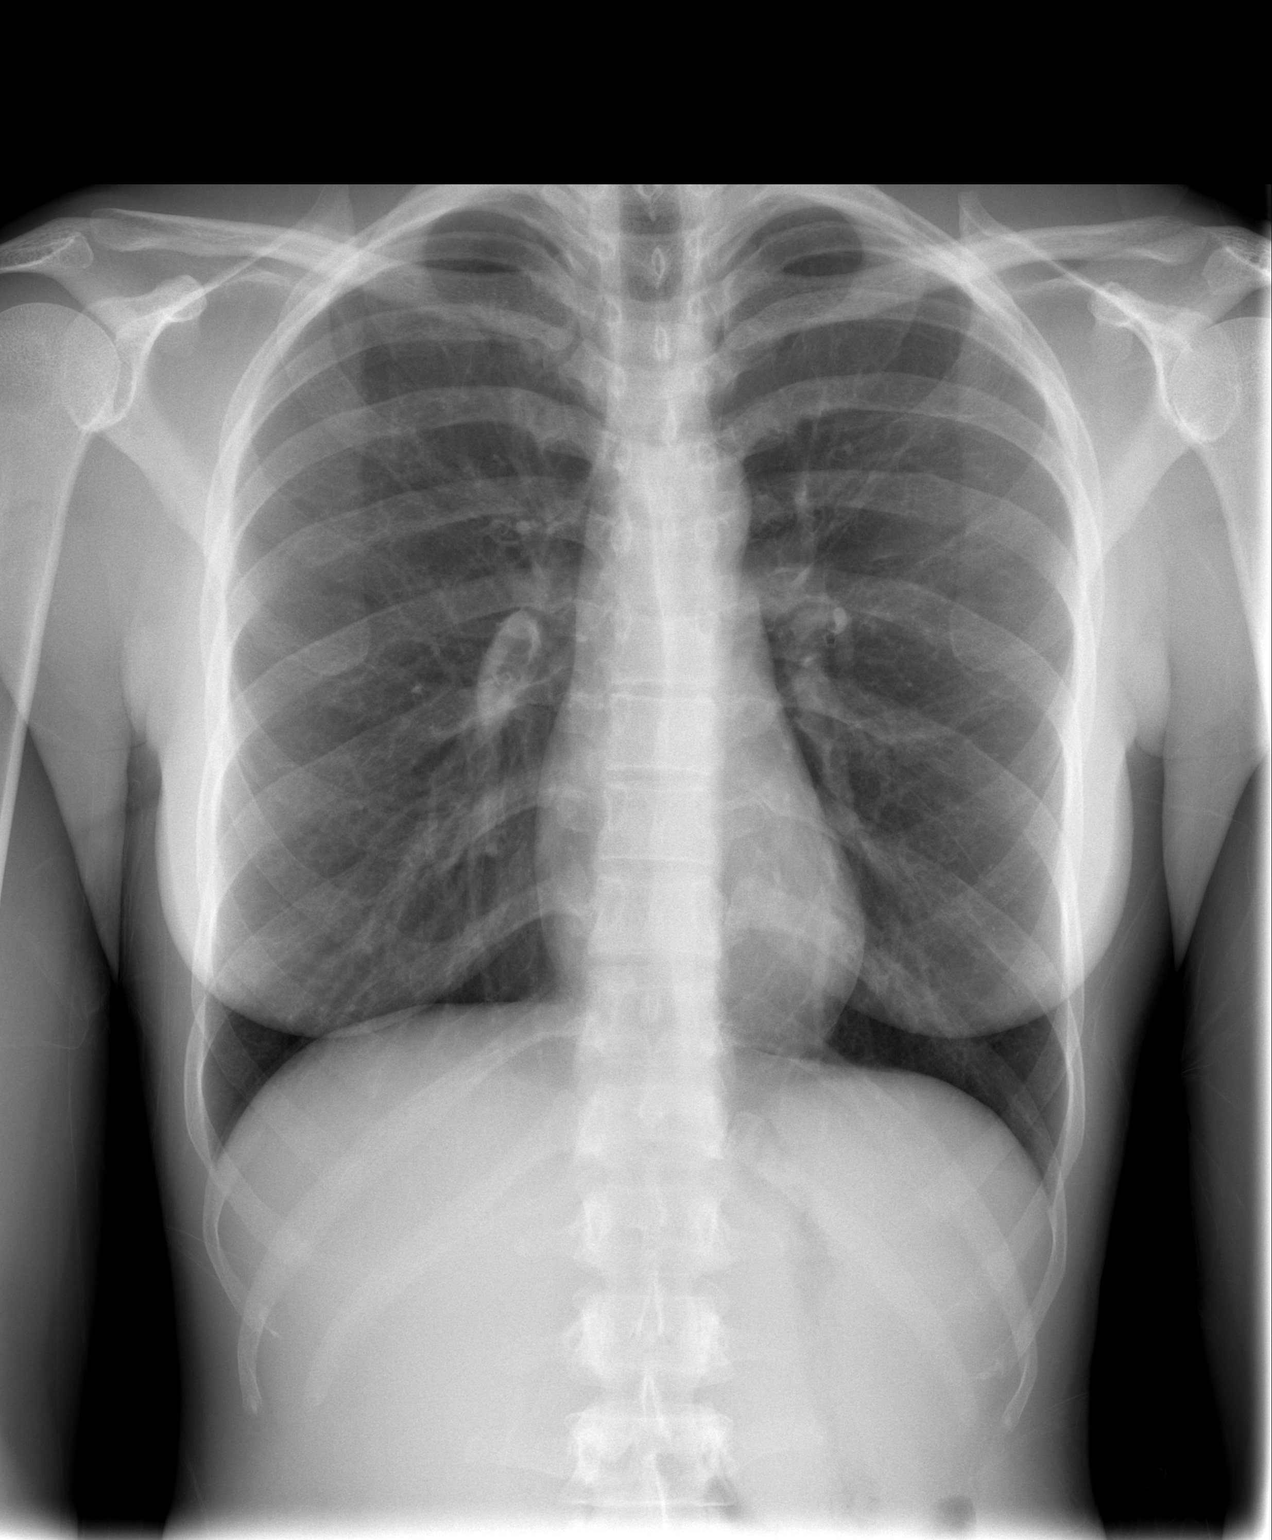

[w chest lat]
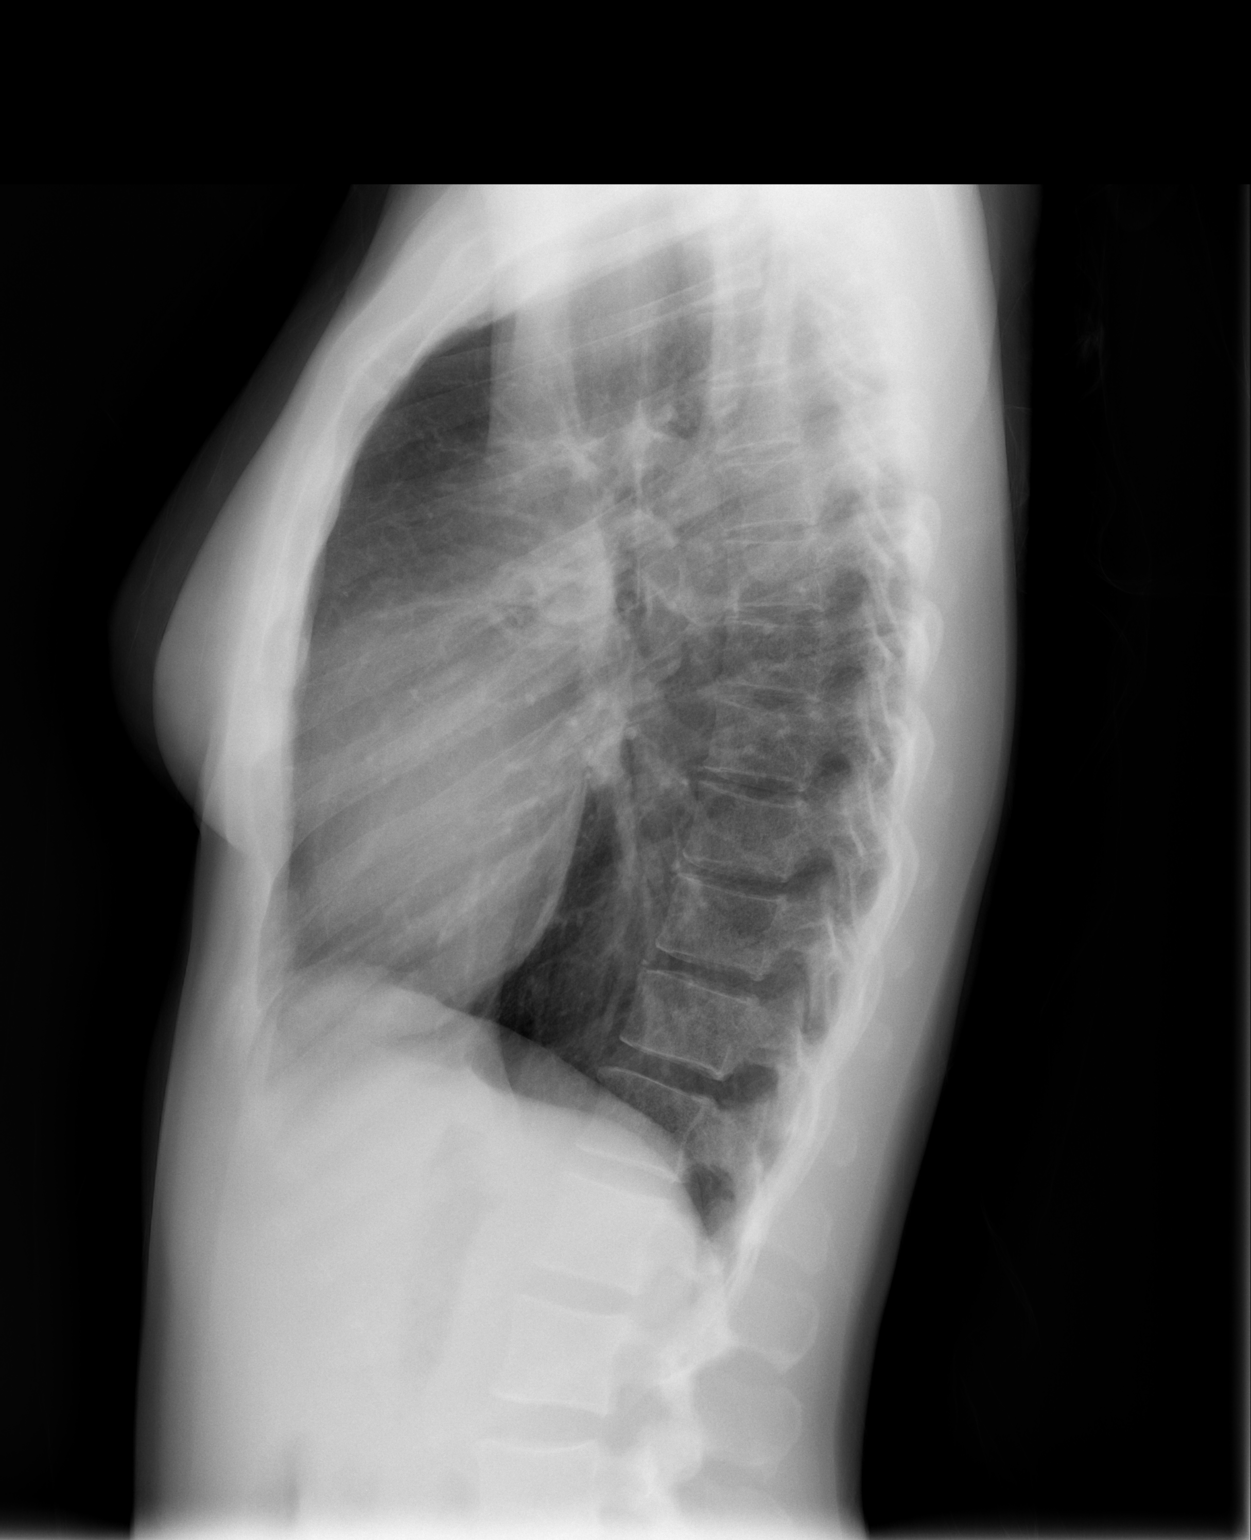

[2 of 2 positions shown; findings below may reference images not displayed]

FINDINGS: The lungs are clear. There is no pneumothorax or pleural effusion.
Heart size is normal. No focal bony abnormality is identified.
IMPRESSION: Negative chest.

## 2016-10-24 DIAGNOSIS — M25531 Pain in right wrist: Secondary | ICD-10-CM | POA: Diagnosis not present

## 2016-10-24 DIAGNOSIS — M79641 Pain in right hand: Secondary | ICD-10-CM | POA: Diagnosis not present

## 2016-10-31 DIAGNOSIS — M25531 Pain in right wrist: Secondary | ICD-10-CM | POA: Diagnosis not present

## 2016-11-27 DIAGNOSIS — Z6823 Body mass index (BMI) 23.0-23.9, adult: Secondary | ICD-10-CM | POA: Diagnosis not present

## 2016-11-27 DIAGNOSIS — Z01419 Encounter for gynecological examination (general) (routine) without abnormal findings: Secondary | ICD-10-CM | POA: Diagnosis not present

## 2017-10-29 ENCOUNTER — Encounter: Payer: 59 | Admitting: Family Medicine

## 2017-11-21 ENCOUNTER — Ambulatory Visit (INDEPENDENT_AMBULATORY_CARE_PROVIDER_SITE_OTHER): Payer: 59 | Admitting: Family Medicine

## 2017-11-21 ENCOUNTER — Encounter: Payer: Self-pay | Admitting: Family Medicine

## 2017-11-21 VITALS — BP 122/80 | HR 116 | Ht 62.0 in | Wt 126.8 lb

## 2017-11-21 DIAGNOSIS — Z114 Encounter for screening for human immunodeficiency virus [HIV]: Secondary | ICD-10-CM

## 2017-11-21 DIAGNOSIS — Z0000 Encounter for general adult medical examination without abnormal findings: Secondary | ICD-10-CM | POA: Insufficient documentation

## 2017-11-21 LAB — CBC
HEMATOCRIT: 45.8 % (ref 36.0–46.0)
Hemoglobin: 15.3 g/dL — ABNORMAL HIGH (ref 12.0–15.0)
MCHC: 33.5 g/dL (ref 30.0–36.0)
MCV: 87.8 fl (ref 78.0–100.0)
Platelets: 684 10*3/uL — ABNORMAL HIGH (ref 150.0–400.0)
RBC: 5.21 Mil/uL — AB (ref 3.87–5.11)
RDW: 14.1 % (ref 11.5–15.5)
WBC: 15.9 10*3/uL — ABNORMAL HIGH (ref 4.0–10.5)

## 2017-11-21 LAB — COMPREHENSIVE METABOLIC PANEL
ALT: 22 U/L (ref 0–35)
AST: 25 U/L (ref 0–37)
Albumin: 4.7 g/dL (ref 3.5–5.2)
Alkaline Phosphatase: 52 U/L (ref 39–117)
BILIRUBIN TOTAL: 1.7 mg/dL — AB (ref 0.2–1.2)
BUN: 14 mg/dL (ref 6–23)
CO2: 29 mEq/L (ref 19–32)
Calcium: 11.1 mg/dL — ABNORMAL HIGH (ref 8.4–10.5)
Chloride: 99 mEq/L (ref 96–112)
Creatinine, Ser: 1.11 mg/dL (ref 0.40–1.20)
GFR: 68.63 mL/min (ref >60.00)
Glucose, Bld: 85 mg/dL (ref 70–99)
Potassium: 4.2 mEq/L (ref 3.5–5.1)
Sodium: 138 mEq/L (ref 135–145)
Total Protein: 7.6 g/dL (ref 6.0–8.3)

## 2017-11-21 LAB — TSH: TSH: 0.64 u[IU]/mL (ref 0.35–4.50)

## 2017-11-21 NOTE — Progress Notes (Signed)
Darlene Davis - 42 y.o. female MRN [id number]  Date of birth: 12-May-1976  Subjective Chief Complaint  Patient presents with  . Annual Exam    HPI Darlene Davis is a 42 y.o. female here today to establish with new pcp and for annual exam.  She denies any significant changes to her health since prior visit.  She denies any concerns today.  She is seen at [facility] for her Pap Smears.  She tells me that her GYN typically orders her mammograms as well.  She follows a fairly healthy lifestyle and exercises regularly.  She uses a seat belt while driving and denies engaging in any risky or dangerous behaviors.  She requests HIV and other appropriate labs, she did have a lipid panel through her work.    No Known Allergies  Past Medical History:  Diagnosis Date  . 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 NO PAST SURGERIES      Social History   Socioeconomic History  . Marital status: Married    Spouse name: Not on file  . Number of children: 0  . Years of education: Not on file  . Highest education level: Not on file  Occupational History  . Not on file  Social Needs  . Financial resource strain: Not on file  . Food insecurity:    Worry: Not on file    Inability: Not on file  . Transportation needs:    Medical: Not on file    Non-medical: Not on file  Tobacco Use  . Smoking status: Never Smoker  . Smokeless tobacco: Never Used  . Tobacco comment: Single-lives alone - [employer], work from home  Substance and Sexual Activity  . Alcohol use: Not Currently    Comment: rarely   . Drug use: Never  . Sexual activity: Not on file  Lifestyle  . Physical activity:    Days per week: Not on file    Minutes per session: Not on file  . Stress: Not on file  Relationships  . Social connections:    Talks on phone: Not on file    Gets together: Not on file    Attends religious service: Not on file    Active member  of club or organization: Not on file    Attends meetings of clubs or organizations: Not on file    Relationship status: Not on file  Other Topics Concern  . Not on file  Social History Narrative   Works for [employer]    Family History  Problem Relation Age of Onset  . Hypertension Mother        hx of, off meds  . Hyperlipidemia Maternal Uncle   . Diabetes Maternal Grandmother   . Hypertension Brother   . Colon cancer Cousin 40       liver, hx EtOH  . Colon cancer Paternal Uncle 53    Health Maintenance  Topic Date Due  . HIV Screening  10/04/1988  . PAP SMEAR  11/19/2014  . INFLUENZA VACCINE  03/06/2018  . TETANUS/TDAP  03/11/2019   Review of Systems  Constitutional: Negative for chills, fever and weight loss.  HENT: Negative for ear pain, hearing loss and sore throat.   Eyes: Negative for blurred vision.  Respiratory: Negative for cough and shortness of breath.   Cardiovascular: Negative for chest pain and palpitations.  Gastrointestinal: Negative for abdominal pain, constipation,  diarrhea, heartburn, nausea and vomiting.  Genitourinary: Negative for dysuria, frequency and urgency.  Musculoskeletal: Negative for myalgias and neck pain.  Skin: Negative for rash.  Neurological: Negative for dizziness.  Endo/Heme/Allergies: Does not bruise/bleed easily.  Psychiatric/Behavioral: Negative for depression, substance abuse and suicidal ideas. The patient is not nervous/anxious.   All other systems reviewed and are negative.    --------------------------------------------------------------------------------------------------------------------------------------------------- Physical Exam BP 122/80 (BP Location: Right Arm, Patient Position: Sitting, Cuff Size: Normal)   Pulse (!) 116   Ht 5\' 2"  (1.575 m)   Wt 126 lb 12.8 oz (57.5 kg)   BMI 23.19 kg/m   Physical Exam  Constitutional: She is oriented to person, place, and time. She appears well-nourished. No  distress.  HENT:  Head: Normocephalic and atraumatic.  Right Ear: External ear normal.  Left Ear: External ear normal.  Mouth/Throat: No oropharyngeal exudate.  Eyes: No scleral icterus.  Neck: Normal range of motion. Neck supple. No thyromegaly present.  Cardiovascular: Normal rate, regular rhythm and normal heart sounds. Exam reveals no friction rub.  No murmur heard. Pulmonary/Chest: Effort normal. No respiratory distress. She has no wheezes.  Abdominal: Soft. Bowel sounds are normal. She exhibits no distension. There is no tenderness.  Musculoskeletal: Normal range of motion. She exhibits no edema.  Lymphadenopathy:    She has no cervical adenopathy.  Neurological: She is alert and oriented to person, place, and time.  Skin: Skin is warm. No rash noted.  Psychiatric: She has a normal mood and affect. Her behavior is normal.    ------------------------------------------------------------------------------------------------------------------------------------------------------------------------------------------------------------------- Assessment and Plan  Well adult exam Well adult without abnormal findings Counseled on continued healthy lifestyle with regular physical activity and healthy diet Keep appt with GYN for Pap and to have mammogram ordered Denies depressive symptoms Anticipatory guidance provided for safety, substance abuse and safe sex practices F/u in 1 year or sooner if needed.

## 2017-11-21 NOTE — Patient Instructions (Signed)
Adult Wellness Guidelines   Adult Health - for Ages 89 and Over Preventive care is very important for adults. By making some good basic health choices, women and men can boost their own health and well-being. Some of these positive choices include:   Eat a healthy diet  Get regular exercise  Don't use tobacco products  Limit alcohol use  Strive for a healthy weight  Adult Recommendations Screenings Physical Exam Every year, or as directed by your doctor. Body Mass Index (BMI) Every year. Blood Pressure (BP) At least every two years. Colon Cancer Screening Beginning at age 91 - colonoscopy every 10 years, or flexible sigmoidoscopy every five years or fecal blood test annually. Diabetes Screening Those with high blood pressure or high cholesterol should be screened. Others, especially those who are overweight or have a close family history of diabetes, should consider being screened every three years. Vision Screening Every year.  Immunizations Tetanus, Diphtheria, Pertussis (Td/ Tdap) Get Tdap vaccine once, then a Td booster every 10 years. Influenza (Flu) Every year. Herpes Zoster (Shingles) One dose given at age 11 and over. Varicella (Chicken Pox) Two doses if no evidence of immunity. Pneumococcal (Pneumonia) One or two doses for adults age 18 and older, or one or two doses depending on indication. Measles, Mumps, Rubella (MMR) One or two doses for adults ages 79-55 if no evidence of immunity. Human Papillomavirus (HPV) Three doses for women ages 19-26 if not already given. Three doses for men ages 19-21 if not already given.* Hepatitis A Two or three doses for adults age 67 and over.** Hepatitis B Three doses for ages 42 and over.** * Recommendations may vary. Discuss the start and frequency of screenings with your doctor, especially if you are at increased risk. ** For select populations. Discuss with your doctor if this vaccine is right for you.  Women's Health  Recommendations Women have their own unique health care needs. To stay well, they should make regular screenings a priority. Women should discuss the recommendations listed on the chart with their doctors.  Mammogram Every year for women beginning at age 12.* Cholesterol Starting age and frequency of screenings are based on your individual risk factors. Talk with your doctor about what is best for you. Pap Test Women ages 21-65: Pap test every three years. Another option for ages 46-65: Pap test and HPV test every five years. Women who have had a hysterectomy or are over age 60 may not need a Pap test.* Osteoporosis Screening Beginning at age 61, or at age 85 if risk factors are present.* Aspirin Use At ages 64-79, talk with your doctor about the benefits and risks of aspirin use. Pelvic Exam Every year for ages 97 and over. Folic Acid Women planning/capable of pregnancy should take a daily supplement containing .4-.8 mg of folic acid for prevention of neural tube defects.  * Recommendations may vary. Discuss screening options with your doctor, especially if you are at increased risk. Sources: [place] Department of Health and Financial controller and the Centers for Disease Control and Prevention, U.S. Preventive Services Task Force   Men's Health Recommendations Men are encouraged to get care as needed and make smart choices. That includes following a healthy lifestyle and getting recommended preventive care services.  Recommended preventative care services are as follows:  Cholesterol Ages 20-35 should be tested if at high risk. Men age 44 and over should be tested. Prostate Cancer Screening Ages 35 and over, discuss the benefits and risks  of screening with your doctor.* Abdominal Aortic Aneurysm Once between ages 65 and 75 if you have ever smoked.  

## 2017-11-21 NOTE — Assessment & Plan Note (Signed)
Well adult without abnormal findings Counseled on continued healthy lifestyle with regular physical activity and healthy diet Keep appt with GYN for Pap and to have mammogram ordered Denies depressive symptoms Anticipatory guidance provided for safety, substance abuse and safe sex practices F/u in 1 year or sooner if needed.

## 2017-11-22 LAB — HIV ANTIBODY (ROUTINE TESTING W REFLEX): HIV 1&2 Ab, 4th Generation: NONREACTIVE

## 2017-11-26 ENCOUNTER — Other Ambulatory Visit: Payer: Self-pay | Admitting: Family Medicine

## 2017-11-26 DIAGNOSIS — D72829 Elevated white blood cell count, unspecified: Secondary | ICD-10-CM

## 2017-11-26 DIAGNOSIS — R17 Unspecified jaundice: Secondary | ICD-10-CM

## 2017-11-28 ENCOUNTER — Other Ambulatory Visit (INDEPENDENT_AMBULATORY_CARE_PROVIDER_SITE_OTHER): Payer: 59

## 2017-11-28 DIAGNOSIS — D75839 Thrombocytosis, unspecified: Secondary | ICD-10-CM

## 2017-11-28 DIAGNOSIS — R17 Unspecified jaundice: Secondary | ICD-10-CM

## 2017-11-28 DIAGNOSIS — D72829 Elevated white blood cell count, unspecified: Secondary | ICD-10-CM | POA: Diagnosis not present

## 2017-11-28 DIAGNOSIS — D473 Essential (hemorrhagic) thrombocythemia: Secondary | ICD-10-CM

## 2017-11-28 LAB — COMPREHENSIVE METABOLIC PANEL
ALT: 18 U/L (ref 0–35)
AST: 21 U/L (ref 0–37)
Albumin: 4.1 g/dL (ref 3.5–5.2)
Alkaline Phosphatase: 50 U/L (ref 39–117)
BUN: 9 mg/dL (ref 6–23)
CALCIUM: 9.3 mg/dL (ref 8.4–10.5)
CHLORIDE: 102 meq/L (ref 96–112)
CO2: 27 meq/L (ref 19–32)
Creatinine, Ser: 0.96 mg/dL (ref 0.40–1.20)
GFR: 81.14 mL/min (ref >60.00)
Glucose, Bld: 77 mg/dL (ref 70–99)
Potassium: 3.7 mEq/L (ref 3.5–5.1)
Sodium: 137 mEq/L (ref 135–145)
Total Bilirubin: 1.7 mg/dL — ABNORMAL HIGH (ref 0.2–1.2)
Total Protein: 7 g/dL (ref 6.0–8.3)

## 2017-11-28 LAB — CBC WITH DIFFERENTIAL/PLATELET
BASOS PCT: 1.4 % (ref 0.0–3.0)
Basophils Absolute: 0.1 10*3/uL (ref 0.0–0.1)
EOS ABS: 0.1 10*3/uL (ref 0.0–0.7)
Eosinophils Relative: 1.6 % (ref 0.0–5.0)
HEMATOCRIT: 41.1 % (ref 36.0–46.0)
Hemoglobin: 14 g/dL (ref 12.0–15.0)
Lymphocytes Relative: 36.6 % (ref 12.0–46.0)
Lymphs Abs: 2.8 10*3/uL (ref 0.7–4.0)
MCHC: 34.2 g/dL (ref 30.0–36.0)
MCV: 87.6 fl (ref 78.0–100.0)
MONOS PCT: 10.6 % (ref 3.0–12.0)
Monocytes Absolute: 0.8 10*3/uL (ref 0.1–1.0)
NEUTROS ABS: 3.9 10*3/uL (ref 1.4–7.7)
Neutrophils Relative %: 49.8 % (ref 43.0–77.0)
PLATELETS: 640 10*3/uL — AB (ref 150.0–400.0)
RBC: 4.69 Mil/uL (ref 3.87–5.11)
RDW: 13.9 % (ref 11.5–15.5)
WBC: 7.8 10*3/uL (ref 4.0–10.5)

## 2017-11-29 NOTE — Addendum Note (Signed)
Addended by: Milford Cage on: 11/29/2017 01:45 PM   Modules accepted: Orders

## 2017-12-03 ENCOUNTER — Telehealth: Payer: Self-pay | Admitting: Internal Medicine

## 2017-12-03 ENCOUNTER — Encounter: Payer: Self-pay | Admitting: Internal Medicine

## 2017-12-03 NOTE — Telephone Encounter (Signed)
Appt has been scheduled for the pt to see Dr. Julien Nordmann on 5/8 at 1130. Pt aware to arrive 30 minutes early. Letter mailed.

## 2017-12-11 ENCOUNTER — Inpatient Hospital Stay: Payer: 59 | Attending: Internal Medicine | Admitting: Internal Medicine

## 2017-12-11 ENCOUNTER — Inpatient Hospital Stay: Payer: 59

## 2017-12-11 ENCOUNTER — Encounter: Payer: Self-pay | Admitting: Internal Medicine

## 2017-12-11 ENCOUNTER — Telehealth: Payer: Self-pay | Admitting: Oncology

## 2017-12-11 VITALS — BP 141/89 | HR 94 | Temp 98.6°F | Resp 18 | Ht 62.0 in | Wt 129.6 lb

## 2017-12-11 DIAGNOSIS — D473 Essential (hemorrhagic) thrombocythemia: Secondary | ICD-10-CM | POA: Insufficient documentation

## 2017-12-11 DIAGNOSIS — D72829 Elevated white blood cell count, unspecified: Secondary | ICD-10-CM | POA: Diagnosis present

## 2017-12-11 LAB — CMP (CANCER CENTER ONLY)
ALBUMIN: 4.3 g/dL (ref 3.5–5.0)
ALT: 19 U/L (ref 0–55)
AST: 23 U/L (ref 5–34)
Alkaline Phosphatase: 55 U/L (ref 40–150)
Anion gap: 6 (ref 3–11)
BILIRUBIN TOTAL: 1.7 mg/dL — AB (ref 0.2–1.2)
BUN: 12 mg/dL (ref 7–26)
CHLORIDE: 103 mmol/L (ref 98–109)
CO2: 30 mmol/L — ABNORMAL HIGH (ref 22–29)
CREATININE: 1.05 mg/dL (ref 0.60–1.10)
Calcium: 10.1 mg/dL (ref 8.4–10.4)
GFR, Est AFR Am: >60 mL/min (ref >60)
GLUCOSE: 79 mg/dL (ref 70–140)
Potassium: 3.8 mmol/L (ref 3.5–5.1)
Sodium: 139 mmol/L (ref 136–145)
Total Protein: 7.5 g/dL (ref 6.4–8.3)

## 2017-12-11 LAB — IRON AND TIBC
IRON: 148 ug/dL — AB (ref 41–142)
Saturation Ratios: 59 % — ABNORMAL HIGH (ref 21–57)
TIBC: 249 ug/dL (ref 236–444)
UIBC: 101 ug/dL

## 2017-12-11 LAB — CBC WITH DIFFERENTIAL (CANCER CENTER ONLY)
Basophils Absolute: 0.1 10*3/uL (ref 0.0–0.1)
Basophils Relative: 1 %
EOS ABS: 0.1 10*3/uL (ref 0.0–0.5)
EOS PCT: 1 %
HCT: 42.9 % (ref 34.8–46.6)
Hemoglobin: 14.2 g/dL (ref 11.6–15.9)
LYMPHS ABS: 2.5 10*3/uL (ref 0.9–3.3)
LYMPHS PCT: 27 %
MCH: 29.3 pg (ref 25.1–34.0)
MCHC: 33.2 g/dL (ref 31.5–36.0)
MCV: 88.3 fL (ref 79.5–101.0)
MONO ABS: 0.9 10*3/uL (ref 0.1–0.9)
Monocytes Relative: 9 %
Neutro Abs: 5.8 10*3/uL (ref 1.5–6.5)
Neutrophils Relative %: 62 %
Platelet Count: 571 10*3/uL — ABNORMAL HIGH (ref 145–400)
RBC: 4.86 MIL/uL (ref 3.70–5.45)
RDW: 14.6 % — AB (ref 11.2–14.5)
WBC: 9.5 10*3/uL (ref 3.9–10.3)

## 2017-12-11 LAB — FERRITIN: Ferritin: 280 ng/mL — ABNORMAL HIGH (ref 9–269)

## 2017-12-11 LAB — LACTATE DEHYDROGENASE: LDH: 195 U/L (ref 125–245)

## 2017-12-11 NOTE — Progress Notes (Signed)
[place] [phone]   [phone]  CONSULT NOTE  REFERRING PHYSICIAN: [name], DO  REASON FOR CONSULTATION:  42 years old African-American female with leukocytosis and thrombocytosis  HPI Darlene Davis is a 42 y.o. 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  The patient was seen recently by new primary care physician for routine physical examination and to establish care.  During her visit she had CBC performed on 11/21/2017 and that showed elevated white blood count of 15.9, hemoglobin was also slightly elevated at 15.3 with hematocrit of 45.8% and platelets count 684,000.  Reviewing her previous records she had elevated platelets count at least for the last 3 years.  She is currently on oral iron tablets 1 tablet p.o. daily.  She has no concerning complaints and she does not have any bleeding issues.  She denied having any recent infection.  She denied having any weight loss or night sweats.  She is not on any steroids treatment but she takes oral contraceptive pills and has been doing it for the last 20 years. The patient has no nausea, vomiting, diarrhea or constipation.  She denied having any chest pain, shortness breath, cough or hemoptysis.  She has no headache or visual changes. Family history significant for mother with hypertension, father died in car accident, brother has hypertension. The patient is single and has no children.  She works for [employer].  She has no history of smoking but drinks alcohol occasionally and no history of drug abuse.  HPI  Past Medical History:  Diagnosis Date  . 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 NO PAST SURGERIES      Family History  Problem Relation Age of Onset  . Hypertension Mother        hx of, off meds  . Hyperlipidemia Maternal Uncle   . Diabetes Maternal Grandmother   .  Hypertension Brother   . Colon cancer Cousin 40       liver, hx EtOH  . Colon cancer Paternal Uncle 58    Social History Social History   Tobacco Use  . Smoking status: Never Smoker  . Smokeless tobacco: Never Used  . Tobacco comment: Single-lives alone - [employer], work from home  Substance Use Topics  . Alcohol use: Not Currently    Comment: rarely   . Drug use: Never    No Known Allergies  Current Outpatient Medications  Medication Sig Dispense Refill  . acyclovir (ZOVIRAX) 400 MG tablet     . Ascorbic Acid (VITAMIN C PO) Take by mouth.    . Cholecalciferol (VITAMIN D PO) Take by mouth.    . norethindrone-ethinyl estradiol 1/35 (NORTREL 1/35, 21,) tablet Take 1 tablet by mouth daily. 1 Package 11   No current facility-administered medications for this visit.     Review of Systems  Constitutional: negative Eyes: negative Ears, nose, mouth, throat, and face: negative Respiratory: negative Cardiovascular: negative Gastrointestinal: negative Genitourinary:negative Integument/breast: negative Hematologic/lymphatic: negative Musculoskeletal:negative Neurological: negative Behavioral/Psych: negative Endocrine: negative Allergic/Immunologic: negative  Physical Exam  IPJ:ASNKN, healthy, no distress, well nourished, well developed and anxious SKIN: skin color, texture, turgor are normal, no rashes or significant lesions HEAD: Normocephalic, No masses, lesions, tenderness or abnormalities EYES: normal, PERRLA, Conjunctiva are pink and non-injected EARS: External ears normal, Canals clear OROPHARYNX:no exudate, no erythema and lips, buccal mucosa, and tongue normal  NECK: supple, no adenopathy, no JVD LYMPH:  no palpable lymphadenopathy, no hepatosplenomegaly BREAST:not examined LUNGS: clear to auscultation , and palpation HEART: regular rate & rhythm, no murmurs and no gallops ABDOMEN:abdomen soft, non-tender, normal bowel sounds and no masses or organomegaly BACK:  Back symmetric, no curvature., No CVA tenderness EXTREMITIES:no joint deformities, effusion, or inflammation, no skin discoloration  NEURO: alert & oriented x 3 with fluent speech, no focal motor/sensory deficits  PERFORMANCE STATUS: ECOG 0  LABORATORY DATA: Lab Results  Component Value Date   WBC 9.5 12/11/2017   HGB 14.2 12/11/2017   HCT 42.9 12/11/2017   MCV 88.3 12/11/2017   PLT 571 (H) 12/11/2017      Chemistry      Component Value Date/Time   NA 137 11/28/2017 0808   K 3.7 11/28/2017 0808   CL 102 11/28/2017 0808   CO2 27 11/28/2017 0808   BUN 9 11/28/2017 0808   CREATININE 0.96 11/28/2017 0808      Component Value Date/Time   CALCIUM 9.3 11/28/2017 0808   ALKPHOS 50 11/28/2017 0808   AST 21 11/28/2017 0808   ALT 18 11/28/2017 0808   BILITOT 1.7 (H) 11/28/2017 6811       RADIOGRAPHIC STUDIES: No results found.  ASSESSMENT: This is a very pleasant 42 years old African-American female presented for evaluation of leukocytosis and thrombocytosis.  This is likely reactive in nature but underlying myeloproliferative disorder could not be ruled out at this point.   PLAN: I had a lengthy discussion with the patient today about her condition and further investigation to confirm her diagnosis. I recommended for the patient to have repeat CBC, comprehensive metabolic panel, LDH, iron study and ferritin as well as molecular studies for Jak 2 mutation panel to rule out myeloproliferative disorder. Repeat CBC today showed improvement of the total white blood count but her platelets count continues to be elevated at 571,000. I will arrange for the patient to come back for follow-up visit in 2 weeks for reevaluation and discussion of the pending lab results and further recommendation regarding her condition. I also advised her to continue on the oral iron tablets for now. She was advised to call immediately if she has any concerning symptoms in the interval. The patient voices  understanding of current disease status and treatment options and is in agreement with the current care plan.  All questions were answered. The patient knows to call the clinic with any problems, questions or concerns. We can certainly see the patient much sooner if necessary.  Thank you so much for allowing me to participate in the care of Darlene Davis. I will continue to follow up the patient with you and assist in her care.  Disclaimer: This note was dictated with voice recognition software. Similar sounding words can inadvertently be transcribed and may not be corrected upon review.   Eilleen Kempf Dec 11, 2017, 12:43 PM

## 2017-12-11 NOTE — Telephone Encounter (Signed)
Appt scheduled AVS/Calendar printed per 5/8 los

## 2017-12-20 LAB — JAK2 (INCLUDING V617F AND EXON 12), MPL,& CALR W/RFL MPN PANEL (NGS)

## 2018-01-01 ENCOUNTER — Encounter: Payer: Self-pay | Admitting: Oncology

## 2018-01-01 ENCOUNTER — Telehealth: Payer: Self-pay | Admitting: Internal Medicine

## 2018-01-01 ENCOUNTER — Inpatient Hospital Stay: Payer: 59 | Admitting: Oncology

## 2018-01-01 VITALS — BP 127/75 | HR 76 | Temp 98.5°F | Resp 17 | Ht 62.0 in | Wt 133.3 lb

## 2018-01-01 DIAGNOSIS — D473 Essential (hemorrhagic) thrombocythemia: Secondary | ICD-10-CM

## 2018-01-01 DIAGNOSIS — D72829 Elevated white blood cell count, unspecified: Secondary | ICD-10-CM

## 2018-01-01 HISTORY — DX: Essential (hemorrhagic) thrombocythemia: D47.3

## 2018-01-01 MED ORDER — HYDROXYUREA 500 MG PO CAPS: 500.0000 mg | ORAL_CAPSULE | Freq: Every day | ORAL | 1 refills | Status: DC

## 2018-01-01 MED ORDER — PROCHLORPERAZINE MALEATE 10 MG PO TABS: 10.0000 mg | ORAL_TABLET | Freq: Four times a day (QID) | ORAL | 0 refills | Status: DC | PRN

## 2018-01-01 NOTE — Assessment & Plan Note (Signed)
This is a very pleasant 42 year old African-American female presented for evaluation of leukocytosis and thrombocytosis.   The patient had additional lab work for further evaluation and is here to discuss the results.  The patient was seen with Dr. Julien Nordmann.  Lab results were discussed with the patient which show that she is positive for the JAK 2 mutation.  This is consistent with a diagnosis of essential thrombocythemia.  The diagnosis, prognosis, and treatment options were discussed with the patient.  Recommend that she begin treatment with hydroxyurea 500 mg daily.  Adverse effects of this medication were discussed including but not limited to rash, hair thinning, and nausea.  She was given a prescription for Compazine 10 mg every 6 hours as needed for nausea and vomiting. The patient will follow-up every 2 weeks for the first month on treatment and then monthly x2 to 3 months and then every 3 months when she is stable on treatment. Follow-up visit will be in 2 weeks for evaluation and repeat lab work.  She was advised to call immediately if she has any concerning symptoms in the interval. The patient voices understanding of current disease status and treatment options and is in agreement with the current care plan.  All questions were answered. The patient knows to call the clinic with any problems, questions or concerns. We can certainly see the patient much sooner if necessary.

## 2018-01-01 NOTE — Progress Notes (Signed)
[facility] OFFICE PROGRESS NOTE  [name], DO [place] [id number]  DIAGNOSIS: Essential thrombocythemia diagnosed in May 2019.  Positive for JAK 2 mutation.  PRIOR THERAPY: None.  CURRENT THERAPY: Hydroxyurea 500 mg daily.  First dose expected on 01/01/2018.  INTERVAL HISTORY: Darlene Davis 43 y.o. female returns for a  routine follow-up visit by herself.  The patient is feeling fine today and has no specific complaints.  Patient denies fevers and chills.  Denies chest pain, shortness breath, cough, hemoptysis.  Denies nausea, vomiting, constipation, diarrhea.  Denies recent weight loss or night sweats.  The patient had lab work and is here to discuss the results.  MEDICAL HISTORY: Past Medical History:  Diagnosis Date  . 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  MEDICATIONS:  Current Outpatient Medications  Medication Sig Dispense Refill  . acyclovir (ZOVIRAX) 400 MG tablet     . Ascorbic Acid (VITAMIN C PO) Take by mouth.    . Cholecalciferol (VITAMIN D PO) Take by mouth.    . Iron-Vitamin C (IRON 100/C) 100-250 MG TABS Take by mouth daily.    . Multiple Vitamin (MULTIVITAMIN) tablet Take 1 tablet by mouth daily.    . norethindrone-ethinyl estradiol 1/35 (NORTREL 1/35, 21,) tablet Take 1 tablet by mouth daily. 1 Package 11  . hydroxyurea (HYDREA) 500 MG capsule Take 1 capsule (500 mg total) by mouth daily. May take with food to minimize GI side effects. 30 capsule 1  . prochlorperazine (COMPAZINE) 10 MG tablet Take 1 tablet (10 mg total) by mouth every 6 (six) hours as needed for nausea or vomiting. 30 tablet 0   No current facility-administered medications for this visit.     SURGICAL HISTORY:  Past Surgical History:  Procedure Laterality Date  . NO PAST SURGERIES      REVIEW OF SYSTEMS:   Review of Systems  Constitutional: Negative for appetite change, chills, fatigue,  fever and unexpected weight change.  HENT:   Negative for mouth sores, nosebleeds, sore throat and trouble swallowing.   Eyes: Negative for eye problems and icterus.  Respiratory: Negative for cough, hemoptysis, shortness of breath and wheezing.   Cardiovascular: Negative for chest pain and leg swelling.  Gastrointestinal: Negative for abdominal pain, constipation, diarrhea, nausea and vomiting.  Genitourinary: Negative for bladder incontinence, difficulty urinating, dysuria, frequency and hematuria.   Musculoskeletal: Negative for back pain, gait problem, neck pain and neck stiffness.  Skin: Negative for itching and rash.  Neurological: Negative for dizziness, extremity weakness, gait problem, headaches, light-headedness and seizures.  Hematological: Negative for adenopathy. Does not bruise/bleed easily.  Psychiatric/Behavioral: Negative for confusion, depression and sleep disturbance. The patient is not nervous/anxious.     PHYSICAL EXAMINATION:  Blood pressure 127/75, pulse 76, temperature 98.5 F (36.9 C), resp. rate 17, height 5\' 2"  (1.575 m), weight 133 lb 4.8 oz (60.5 kg), SpO2 100 %.  ECOG PERFORMANCE STATUS: 0 - Asymptomatic  Physical Exam  Constitutional: Oriented to person, place, and time and well-developed, well-nourished, and in no distress. No distress.  HENT:  Head: Normocephalic and atraumatic.  Mouth/Throat: Oropharynx is clear and moist. No oropharyngeal exudate.  Eyes: Conjunctivae are normal. Right eye exhibits no discharge. Left eye exhibits no discharge. No scleral icterus.  Neck: Normal range of motion. Neck supple.  Cardiovascular: Normal rate, regular rhythm, normal heart sounds and intact distal pulses.   Pulmonary/Chest: Effort normal and  breath sounds normal. No respiratory distress. No wheezes. No rales.  Abdominal: Soft. Bowel sounds are normal. Exhibits no distension and no mass. There is no tenderness.  Musculoskeletal: Normal range of motion. Exhibits  no edema.  Lymphadenopathy:    No cervical adenopathy.  Neurological: Alert and oriented to person, place, and time. Exhibits normal muscle tone. Gait normal. Coordination normal.  Skin: Skin is warm and dry. No rash noted. Not diaphoretic. No erythema. No pallor.  Psychiatric: Mood, memory and judgment normal.  Vitals reviewed.  LABORATORY DATA: Lab Results  Component Value Date   WBC 9.5 12/11/2017   HGB 14.2 12/11/2017   HCT 42.9 12/11/2017   MCV 88.3 12/11/2017   PLT 571 (H) 12/11/2017      Chemistry      Component Value Date/Time   NA 139 12/11/2017 1325   K 3.8 12/11/2017 1325   CL 103 12/11/2017 1325   CO2 30 (H) 12/11/2017 1325   BUN 12 12/11/2017 1325   CREATININE 1.05 12/11/2017 1325      Component Value Date/Time   CALCIUM 10.1 12/11/2017 1325   ALKPHOS 55 12/11/2017 1325   AST 23 12/11/2017 1325   ALT 19 12/11/2017 1325   BILITOT 1.7 (H) 12/11/2017 1325     Labs from 12/11/2017: LDH 195, ferritin 280, iron 148, TIBC 249, percent saturation 59%, U IBC 101, patient is positive for the JAK 2 mutation.  RADIOGRAPHIC STUDIES:  No results found.   ASSESSMENT/PLAN:  Essential thrombocythemia [facility]) This is a very pleasant 42 year old African-American female presented for evaluation of leukocytosis and thrombocytosis.   The patient had additional lab work for further evaluation and is here to discuss the results.  The patient was seen with Dr. Julien Nordmann.  Lab results were discussed with the patient which show that she is positive for the JAK 2 mutation.  This is consistent with a diagnosis of essential thrombocythemia.  The diagnosis, prognosis, and treatment options were discussed with the patient.  Recommend that she begin treatment with hydroxyurea 500 mg daily.  Adverse effects of this medication were discussed including but not limited to rash, hair thinning, and nausea.  She was given a prescription for Compazine 10 mg every 6 hours as needed for nausea and  vomiting. The patient will follow-up every 2 weeks for the first month on treatment and then monthly x2 to 3 months and then every 3 months when she is stable on treatment. Follow-up visit will be in 2 weeks for evaluation and repeat lab work.  She was advised to call immediately if she has any concerning symptoms in the interval. The patient voices understanding of current disease status and treatment options and is in agreement with the current care plan.  All questions were answered. The patient knows to call the clinic with any problems, questions or concerns. We can certainly see the patient much sooner if necessary.   Orders Placed This Encounter  Procedures  . CBC with Differential (Cancer Center Only)    Standing Status:   Future    Standing Expiration Date:   01/02/2019  . CMP ([place] only)    Standing Status:   Future    Standing Expiration Date:   01/02/2019  . Lactate dehydrogenase    Standing Status:   Future    Standing Expiration Date:   01/02/2019  . CBC with Differential (Cancer Center Only)    Standing Status:   Future    Standing Expiration Date:   01/02/2019  .  CMP ([place] only)    Standing Status:   Future    Standing Expiration Date:   01/02/2019  . Lactate dehydrogenase    Standing Status:   Future    Standing Expiration Date:   01/02/2019   Mikey Bussing, DNP, AGPCNP-BC, AOCNP 01/01/18  ADDENDUM: Hematology/Oncology Attending: I had a face-to-face encounter with the patient today.  I recommended her care plan.  This is a very pleasant 42 years old African-American female recently diagnosed with essential thrombocythemia/myeloproliferative disorder.  The patient had persistent thrombocytosis for several years.  Molecular studies performed recently showed positive Jak 2 mutations. I discussed with the patient her lab results and treatment options.  I recommended for the patient treatment with hydroxyurea 500 mg p.o. daily.  She was given  information about essential thrombocythemia as well as hydroxyurea and the adverse effects. We will continue to monitor her closely and has repeat CBC, comprehensive metabolic panel and LDH in 2 weeks. The patient will come back for follow-up visit at that time. She was advised to call immediately if she has any concerning symptoms in the interval.  Disclaimer: This note was dictated with voice recognition software. Similar sounding words can inadvertently be transcribed and may be missed upon review.  Eilleen Kempf, MD 01/01/18

## 2018-01-01 NOTE — Telephone Encounter (Signed)
Appointments scheduled AVS/Calendar printed per 5/29 los °

## 2018-01-01 NOTE — Patient Instructions (Signed)
Essential Thrombocythemia Essential thrombocythemia is a condition in which a person has too many platelets (thrombocytes) in the blood. Platelets are parts of blood that stick together and form a clot (thrombus) to help the body stop bleeding after an injury. This condition may also be called primary or essential thrombocytosis. Essential thrombocythemia happens when abnormal cells in the bone marrow (megakaryocytes) make too many platelets. What are the causes? The cause of this condition is not known. What are the signs or symptoms? This condition may not cause any symptoms. If you have symptoms, they may include:  Weakness.  Headache.  Itching.  Sweating.  Fever.  Dizziness or confusion.  Tingling or burning in your hands or feet.  Blood clots.  Bleeding.  Enlarged spleen.  How is this diagnosed? This condition may be diagnosed based on:  A physical exam.  Your symptoms.  Your medical history.  Blood tests.  A procedure to collect a sample of your bone marrow (bone marrow aspiration) for testing.  How is this treated? If you do not have symptoms, you may not need treatment. Your health care provider may monitor your condition with regular blood tests. If you have symptoms, or if your platelet count is very high, you may be treated with:  Aspirin or other medicines to thin the blood and prevent blood clots.  Medicines to reduce the number of platelets in your blood.  A procedure to remove some platelets from your blood (plateletpheresis). During this procedure: ? Your health care provider will place an IV into one of your veins. ? The IV will be used to draw blood into a machine that separates out the extra platelets. ? The blood with reduced platelets will be returned to your body.  Follow these instructions at home:  Take over-the-counter and prescription medicines only as told by your health care provider.  If you are taking blood thinners: ? Talk with  your health care provider before you take any medicines that contain aspirin or NSAIDs. These medicines increase your risk for dangerous bleeding. ? Take your medicine exactly as told, at the same time every day. ? Avoid activities that could cause injury or bruising, and follow instructions about how to prevent falls. ? Wear a medical alert bracelet or carry a card that lists what medicines you take.  Tell all health care providers, including dentists, about any medicines you are taking to prevent blood clots.  Do not use any products that contain nicotine or tobacco, such as cigarettes and e-cigarettes. If you need help quitting, ask your health care provider.  Ask your health care provider about managing or preventing high cholesterol, high blood pressure, and diabetes. These conditions can make essential thrombocythemia worse.  Keep all follow-up visits as told by your health care provider. This is important. Contact a health care provider if:  You have severe pain, and medicines do not help.  You have problems taking your medicines to prevent blood clots.  You faint. Get help right away if:  You have bleeding or blood clots.  You have unusual bruises.  You have bloody or tarry stools.  You have pink or bloody urine.  Your menstrual periods are heavier than normal, if applicable.  You have nosebleeds and bleeding gums.  You have chest pain.  You have trouble breathing.  You have any symptoms of a stroke. "BE FAST" is an easy way to remember the main warning signs of a stroke: ? B - Balance. Signs are dizziness, sudden trouble   walking, or loss of balance. ? E - Eyes. Signs are trouble seeing or a sudden change in vision. ? F - Face. Signs are sudden weakness or numbness of the face, or the face or eyelid drooping on one side. ? A - Arm. Signs are weakness or numbness in an arm. This happens suddenly and usually on one side of the body. ? S - Speech. Signs are sudden  trouble speaking, slurred speech, or trouble understanding what people say. ? T - Time. Time to call emergency services. Write down what time symptoms started.  You have other signs of a stroke, such as: ? A sudden, severe headache with no known cause. ? Nausea or vomiting. ? Seizure. These symptoms may represent a serious problem that is an emergency. Do not wait to see if the symptoms will go away. Get medical help right away. Call your local emergency services (911 in the [place] Do not drive yourself to the hospital. Summary  Essential thrombocythemia happens when abnormal cells in the bone marrow make too many platelets.  If you have symptoms, or if your platelet count is very high, you may need treatment.  Treatment can vary and may include medicines to thin the blood and prevent blood clots.  Ask your health care provider about how to manage or prevent high cholesterol, high blood pressure, and diabetes. These conditions can make essential thrombocythemia worse.  Get help right away if you have any symptoms of stroke. This information is not intended to replace advice given to you by your health care provider. Make sure you discuss any questions you have with your health care provider. Document Released: 12/07/2016 Document Revised: 12/07/2016 Document Reviewed: 12/07/2016 Elsevier Interactive Patient Education  2018 [employer] Hydroxyurea capsules What is this medicine? HYDROXYUREA (hye drox ee yoor EE a) is a chemotherapy drug. This medicine is used to treat certain types of leukemias and head and neck cancer. It is also used to control the painful crises of sickle cell anemia. This medicine may be used for other purposes; ask your health care provider or pharmacist if you have questions. COMMON BRAND NAME(S): Droxia, Hydrea What should I tell my health care provider before I take this medicine? They need to know if you have any of these conditions: -gout or high levels of  uric acid in the blood -HIV or AIDS -kidney disease or on hemodialysis -leg wounds or ulcers -low blood counts, like low white cell, platelet, or red cell counts -prior or current interferon therapy -recent or ongoing radiation therapy -scheduled to receive a vaccine -an unusual or allergic reaction to hydroxyurea, other medicines, foods, dyes, or preservatives -pregnant or trying to get pregnant -breast-feeding How should I use this medicine? Take this medicine by mouth with a glass of water. Follow the directions on the prescription label. Take your medicine at regular intervals. Do not take it more often than directed. Do not stop taking except on your doctor's advice. People who are not taking this medicine should not be exposed to it. Wash your hands before and after handling your bottle or medicine. Caregivers should wear disposable gloves if they must touch the bottle or medicine. Clean up any medicine powder that spills with a damp disposable towel and throw the towel away in a closed container, such as a plastic bag. A special MedGuide will be given to you by the pharmacist with each prescription and refill of Droxyia. Be sure to read this information carefully each time. Talk to your   pediatrician regarding the use of this medicine in children. Special care may be needed. Overdosage: If you think you have taken too much of this medicine contact a poison control center or emergency room at once. NOTE: This medicine is only for you. Do not share this medicine with others. What if I miss a dose? If you miss a dose, take it as soon as you can. If it is almost time for your next dose, take only that dose. Do not take double or extra doses. What may interact with this medicine? This medicine may also interact with the following medications: -didanosine -stavudine -live virus vaccines This list may not describe all possible interactions. Give your health care provider a list of all the  medicines, herbs, non-prescription drugs, or dietary supplements you use. Also tell them if you smoke, drink alcohol, or use illegal drugs. Some items may interact with your medicine. What should I watch for while using this medicine? This drug may make you feel generally unwell. This is not uncommon, as chemotherapy can affect healthy cells as well as cancer cells. Report any side effects. Continue your course of treatment even though you feel ill unless your doctor tells you to stop. You will receive regular blood tests during your treatment. Call your doctor or health care professional for advice if you get a fever, chills or sore throat, or other symptoms of a cold or flu. Do not treat yourself. This drug decreases your body's ability to fight infections. Try to avoid being around people who are sick. This medicine may increase your risk to bruise or bleed. Call your doctor or health care professional if you notice any unusual bleeding. Talk to your doctor about your risk of cancer. You may be more at risk for certain types of cancers if you take this medicine. Keep out of the sun. If you cannot avoid being in the sun, wear protective clothing and use sunscreen. Do not use sun lamps or tanning beds/booths. Do not become pregnant while taking this medicine or for at least 6 months after stopping it. Women should inform their doctor if they wish to become pregnant or think they might be pregnant. Men should not father a child while taking this medicine and for at least a year after stopping it. There is a potential for serious side effects to an unborn child. Talk to your health care professional or pharmacist for more information. Do not breast-feed an infant while taking this medicine. This may interfere with the ability to have or father a child. You should talk with your doctor or health care professional if you are concerned about your fertility. What side effects may I notice from receiving this  medicine? Side effects that you should report to your doctor or health care professional as soon as possible: -allergic reactions like skin rash, itching or hives, swelling of the face, lips, or tongue -breathing problems -burning, redness or pain at the site of any radiation therapy -low blood counts - this medicine may decrease the number of white blood cells, red blood cells and platelets. You may be at increased risk for infections and bleeding. -signs of decreased platelets or bleeding - bruising, pinpoint red spots on the skin, black, tarry stools, blood in the urine -signs of decreased red blood cells - unusually weak or tired, fainting spells, lightheadedness -signs of infection - fever or chills, cough, sore throat, pain or difficulty passing urine -signs and symptoms of bleeding such as bloody or black, tarry   stools; red or dark-brown urine; spitting up blood or brown material that looks like coffee grounds; red spots on the skin; unusual bruising or bleeding from the eye, gums, or nose -skin ulcers Side effects that usually do not require medical attention (report to your doctor or health care professional if they continue or are bothersome): -constipation -diarrhea -loss of appetite -mouth sores -nausea This list may not describe all possible side effects. Call your doctor for medical advice about side effects. You may report side effects to FDA at 1-800-FDA-1088. Where should I keep my medicine? Keep out of the reach of children. See product for storage instructions. Each product may have different instructions. Keep tightly closed. Throw away any unused medicine after the expiration date. NOTE: This sheet is a summary. It may not cover all possible information. If you have questions about this medicine, talk to your doctor, pharmacist, or health care provider.  2018 Elsevier/Gold Standard (2016-07-26 11:43:13)   

## 2018-01-15 ENCOUNTER — Other Ambulatory Visit: Payer: 59

## 2018-01-16 ENCOUNTER — Inpatient Hospital Stay: Payer: 59 | Admitting: Oncology

## 2018-01-16 ENCOUNTER — Inpatient Hospital Stay: Payer: 59 | Attending: Internal Medicine

## 2018-01-16 VITALS — BP 130/89 | HR 69 | Temp 98.3°F | Resp 17 | Ht 62.0 in | Wt 135.6 lb

## 2018-01-16 DIAGNOSIS — D473 Essential (hemorrhagic) thrombocythemia: Secondary | ICD-10-CM | POA: Diagnosis present

## 2018-01-16 LAB — CBC WITH DIFFERENTIAL (CANCER CENTER ONLY)
BASOS PCT: 1 %
Basophils Absolute: 0.1 10*3/uL (ref 0.0–0.1)
EOS PCT: 1 %
Eosinophils Absolute: 0.1 10*3/uL (ref 0.0–0.5)
HEMATOCRIT: 40.4 % (ref 34.8–46.6)
Hemoglobin: 13.3 g/dL (ref 11.6–15.9)
Lymphocytes Relative: 32 %
Lymphs Abs: 2.2 10*3/uL (ref 0.9–3.3)
MCH: 29.6 pg (ref 25.1–34.0)
MCHC: 32.8 g/dL (ref 31.5–36.0)
MCV: 90.3 fL (ref 79.5–101.0)
MONO ABS: 0.6 10*3/uL (ref 0.1–0.9)
MONOS PCT: 9 %
Neutro Abs: 4 10*3/uL (ref 1.5–6.5)
Neutrophils Relative %: 57 %
PLATELETS: 488 10*3/uL — AB (ref 145–400)
RBC: 4.48 MIL/uL (ref 3.70–5.45)
RDW: 15.4 % — AB (ref 11.2–14.5)
WBC Count: 7 10*3/uL (ref 3.9–10.3)

## 2018-01-16 LAB — CMP (CANCER CENTER ONLY)
ALK PHOS: 55 U/L (ref 40–150)
ALT: 20 U/L (ref 0–55)
AST: 21 U/L (ref 5–34)
Albumin: 4 g/dL (ref 3.5–5.0)
Anion gap: 7 (ref 3–11)
BILIRUBIN TOTAL: 0.7 mg/dL (ref 0.2–1.2)
BUN: 7 mg/dL (ref 7–26)
CALCIUM: 9.2 mg/dL (ref 8.4–10.4)
CO2: 29 mmol/L (ref 22–29)
CREATININE: 0.94 mg/dL (ref 0.60–1.10)
Chloride: 105 mmol/L (ref 98–109)
GFR, Est AFR Am: >60 mL/min (ref >60)
GFR, Estimated: >60 mL/min (ref >60)
Glucose, Bld: 76 mg/dL (ref 70–140)
Potassium: 3.8 mmol/L (ref 3.5–5.1)
SODIUM: 141 mmol/L (ref 136–145)
Total Protein: 7.4 g/dL (ref 6.4–8.3)

## 2018-01-16 LAB — LACTATE DEHYDROGENASE: LDH: 192 U/L (ref 125–245)

## 2018-01-16 NOTE — Progress Notes (Signed)
[facility] OFFICE PROGRESS NOTE  Darlene Nutting, DO [place] [address]  DIAGNOSIS: Essential thrombocythemia diagnosed in May 2019.  Positive for JAK 2 mutation.  PRIOR THERAPY: None  CURRENT THERAPY: Hydroxyurea 500 mg daily.  The first dose was started on 01/01/2018.  INTERVAL HISTORY: Darlene Davis 42 y.o. female returns for routine follow-up visit by herself.  The patient is feeling fine today has no specific complaints.  She started treatment with hydroxyurea and is tolerating it well overall.  She denies fevers and chills.  Denies chest pain, shortness breath, cough, hemoptysis.  Denies nausea, vomiting, constipation, diarrhea.  Denies recent weight loss or night sweats.  Denies skin changes.  The patient is here for evaluation and repeat lab work.  MEDICAL HISTORY: Past Medical History:  Diagnosis Date  . 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  MEDICATIONS:  Current Outpatient Medications  Medication Sig Dispense Refill  . acyclovir (ZOVIRAX) 400 MG tablet     . Ascorbic Acid (VITAMIN C PO) Take by mouth.    . Cholecalciferol (VITAMIN D PO) Take by mouth.    . hydroxyurea (HYDREA) 500 MG capsule Take 1 capsule (500 mg total) by mouth daily. May take with food to minimize GI side effects. 30 capsule 1  . Iron-Vitamin C (IRON 100/C) 100-250 MG TABS Take by mouth daily.    . Multiple Vitamin (MULTIVITAMIN) tablet Take 1 tablet by mouth daily.    . norethindrone-ethinyl estradiol 1/35 (NORTREL 1/35, 21,) tablet Take 1 tablet by mouth daily. 1 Package 11  . prochlorperazine (COMPAZINE) 10 MG tablet Take 1 tablet (10 mg total) by mouth every 6 (six) hours as needed for nausea or vomiting. 30 tablet 0   No current facility-administered medications for this visit.     SURGICAL HISTORY:  Past Surgical History:  Procedure Laterality Date  . NO PAST SURGERIES      REVIEW OF SYSTEMS:    Review of Systems  Constitutional: Negative for appetite change, chills, fatigue, fever and unexpected weight change.  HENT:   Negative for mouth sores, nosebleeds, sore throat and trouble swallowing.   Eyes: Negative for eye problems and icterus.  Respiratory: Negative for cough, hemoptysis, shortness of breath and wheezing.   Cardiovascular: Negative for chest pain and leg swelling.  Gastrointestinal: Negative for abdominal pain, constipation, diarrhea, nausea and vomiting.  Genitourinary: Negative for bladder incontinence, difficulty urinating, dysuria, frequency and hematuria.   Musculoskeletal: Negative for back pain, gait problem, neck pain and neck stiffness.  Skin: Negative for itching and rash.  Neurological: Negative for dizziness, extremity weakness, gait problem, headaches, light-headedness and seizures.  Hematological: Negative for adenopathy. Does not bruise/bleed easily.  Psychiatric/Behavioral: Negative for confusion, depression and sleep disturbance. The patient is not nervous/anxious.     PHYSICAL EXAMINATION:  Blood pressure 130/89, pulse 69, temperature 98.3 F (36.8 C), temperature source Oral, resp. rate 17, height 5\' 2"  (1.575 m), weight 135 lb 9.6 oz (61.5 kg), SpO2 100 %.  ECOG PERFORMANCE STATUS: 0 - Asymptomatic  Physical Exam  Constitutional: Oriented to person, place, and time and well-developed, well-nourished, and in no distress. No distress.  HENT:  Head: Normocephalic and atraumatic.  Mouth/Throat: Oropharynx is clear and moist. No oropharyngeal exudate.  Eyes: Conjunctivae are normal. Right eye exhibits no discharge. Left eye exhibits no discharge. No scleral icterus.  Neck: Normal range of motion. Neck supple.  Cardiovascular: Normal  rate, regular rhythm, normal heart sounds and intact distal pulses.   Pulmonary/Chest: Effort normal and breath sounds normal. No respiratory distress. No wheezes. No rales.  Abdominal: Soft. Bowel sounds are normal.  Exhibits no distension and no mass. There is no tenderness.  Musculoskeletal: Normal range of motion. Exhibits no edema.  Lymphadenopathy:    No cervical adenopathy.  Neurological: Alert and oriented to person, place, and time. Exhibits normal muscle tone. Gait normal. Coordination normal.  Skin: Skin is warm and dry. No rash noted. Not diaphoretic. No erythema. No pallor.  Psychiatric: Mood, memory and judgment normal.  Vitals reviewed.  LABORATORY DATA: Lab Results  Component Value Date   WBC 7.0 01/16/2018   HGB 13.3 01/16/2018   HCT 40.4 01/16/2018   MCV 90.3 01/16/2018   PLT 488 (H) 01/16/2018      Chemistry      Component Value Date/Time   NA 141 01/16/2018 1230   K 3.8 01/16/2018 1230   CL 105 01/16/2018 1230   CO2 29 01/16/2018 1230   BUN 7 01/16/2018 1230   CREATININE 0.94 01/16/2018 1230      Component Value Date/Time   CALCIUM 9.2 01/16/2018 1230   ALKPHOS 55 01/16/2018 1230   AST 21 01/16/2018 1230   ALT 20 01/16/2018 1230   BILITOT 0.7 01/16/2018 1230       RADIOGRAPHIC STUDIES:  No results found.   ASSESSMENT/PLAN:  Essential thrombocythemia [facility]) This is a very pleasant 42 year old African-American female with essential thrombocythemia.   She is positive for the JAK 2 mutation.  The patient is currently on treatment with hydroxyurea 500 mg daily.  She is tolerating this medication well overall with no concerning complaints.  Labs reviewed and platelet count has declined to 488,000.  Her white blood cell count hemoglobin remained normal.  Recommend that she continue hydroxyurea 500 mg daily.  The patient will follow-up in 2 weeks for evaluation and repeat lab work.  If her labs remain stable, we will consider seeing the patient once a month.  She was advised to call immediately if she has any concerning symptoms in the interval. The patient voices understanding of current disease status and treatment options and is in agreement with the current care  plan.  All questions were answered. The patient knows to call the clinic with any problems, questions or concerns. We can certainly see the patient much sooner if necessary.   No orders of the defined types were placed in this encounter.  Mikey Bussing, DNP, AGPCNP-BC, AOCNP 01/16/18

## 2018-01-16 NOTE — Assessment & Plan Note (Addendum)
This is a very pleasant 42 year old African-American female with essential thrombocythemia.   She is positive for the JAK 2 mutation.  The patient is currently on treatment with hydroxyurea 500 mg daily.  She is tolerating this medication well overall with no concerning complaints.  Labs reviewed and platelet count has declined to 488,000.  Her white blood cell count hemoglobin remained normal.  Recommend that she continue hydroxyurea 500 mg daily.  The patient will follow-up in 2 weeks for evaluation and repeat lab work.  If her labs remain stable, we will consider seeing the patient once a month.  She was advised to call immediately if she has any concerning symptoms in the interval. The patient voices understanding of current disease status and treatment options and is in agreement with the current care plan.  All questions were answered. The patient knows to call the clinic with any problems, questions or concerns. We can certainly see the patient much sooner if necessary.

## 2018-01-30 ENCOUNTER — Inpatient Hospital Stay: Payer: 59

## 2018-01-30 ENCOUNTER — Telehealth: Payer: Self-pay | Admitting: Internal Medicine

## 2018-01-30 ENCOUNTER — Inpatient Hospital Stay: Payer: 59 | Admitting: Oncology

## 2018-01-30 ENCOUNTER — Encounter: Payer: Self-pay | Admitting: Oncology

## 2018-01-30 DIAGNOSIS — D473 Essential (hemorrhagic) thrombocythemia: Secondary | ICD-10-CM

## 2018-01-30 LAB — CBC WITH DIFFERENTIAL (CANCER CENTER ONLY)
Basophils Absolute: 0.1 10*3/uL (ref 0.0–0.1)
Basophils Relative: 1 %
Eosinophils Absolute: 0.1 10*3/uL (ref 0.0–0.5)
Eosinophils Relative: 1 %
HCT: 41.5 % (ref 34.8–46.6)
HEMOGLOBIN: 13.8 g/dL (ref 11.6–15.9)
LYMPHS ABS: 2.5 10*3/uL (ref 0.9–3.3)
Lymphocytes Relative: 29 %
MCH: 30 pg (ref 25.1–34.0)
MCHC: 33.3 g/dL (ref 31.5–36.0)
MCV: 90.2 fL (ref 79.5–101.0)
MONOS PCT: 8 %
Monocytes Absolute: 0.7 10*3/uL (ref 0.1–0.9)
NEUTROS PCT: 61 %
Neutro Abs: 5.3 10*3/uL (ref 1.5–6.5)
Platelet Count: 468 10*3/uL — ABNORMAL HIGH (ref 145–400)
RBC: 4.6 MIL/uL (ref 3.70–5.45)
RDW: 15.2 % — ABNORMAL HIGH (ref 11.2–14.5)
WBC Count: 8.7 10*3/uL (ref 3.9–10.3)

## 2018-01-30 LAB — CMP (CANCER CENTER ONLY)
ALK PHOS: 54 U/L (ref 38–126)
ALT: 20 U/L (ref 0–44)
AST: 19 U/L (ref 15–41)
Albumin: 4 g/dL (ref 3.5–5.0)
Anion gap: 7 (ref 5–15)
BUN: 8 mg/dL (ref 6–20)
CALCIUM: 9.8 mg/dL (ref 8.9–10.3)
CHLORIDE: 102 mmol/L (ref 98–111)
CO2: 31 mmol/L (ref 22–32)
CREATININE: 1.03 mg/dL — AB (ref 0.44–1.00)
GFR, Est AFR Am: >60 mL/min (ref >60)
Glucose, Bld: 65 mg/dL — ABNORMAL LOW (ref 70–99)
Potassium: 4.4 mmol/L (ref 3.5–5.1)
Sodium: 140 mmol/L (ref 135–145)
Total Bilirubin: 1.3 mg/dL — ABNORMAL HIGH (ref 0.3–1.2)
Total Protein: 7.5 g/dL (ref 6.5–8.1)

## 2018-01-30 LAB — LACTATE DEHYDROGENASE: LDH: 180 U/L (ref 98–192)

## 2018-01-30 MED ORDER — HYDROXYUREA 500 MG PO CAPS: 500.0000 mg | ORAL_CAPSULE | Freq: Every day | ORAL | 2 refills | Status: DC

## 2018-01-30 NOTE — Telephone Encounter (Signed)
Appointments scheduled AVS/Calendar printed per 6/27 los °

## 2018-01-30 NOTE — Progress Notes (Signed)
[facility] OFFICE PROGRESS NOTE  [name], DO [place] [address]  DIAGNOSIS: Essential thrombocythemia diagnosed in May 2019. Positive for JAK2 mutation.  PRIOR THERAPY: None  CURRENT THERAPY: Hydroxyurea 500 mg daily.  The first dose was started on 01/01/2018.  Status post 1 month of therapy.  INTERVAL HISTORY: Darlene Davis 42 y.o. female returns for routine follow-up visit by herself.  The patient is feeling fine today and has no specific complaints except for intermittent sensation of being off balance.  She has had 3-4 episodes over the past few weeks.  She denies any sinus congestion or the sensation of fluid in her ears.  She denies fevers and chills.  Denies chest pain, shortness of breath, cough, hemoptysis.  Denies nausea, vomiting, constipation, diarrhea.  Denies recent weight loss or night sweats.  Denies skin changes.  Patient is here for evaluation and repeat lab work.  MEDICAL HISTORY: Past Medical History:  Diagnosis Date  . 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  MEDICATIONS:  Current Outpatient Medications  Medication Sig Dispense Refill  . acyclovir (ZOVIRAX) 400 MG tablet     . Ascorbic Acid (VITAMIN C PO) Take by mouth.    . Cholecalciferol (VITAMIN D PO) Take by mouth.    . hydroxyurea (HYDREA) 500 MG capsule Take 1 capsule (500 mg total) by mouth daily. May take with food to minimize GI side effects. 30 capsule 2  . Iron-Vitamin C (IRON 100/C) 100-250 MG TABS Take by mouth daily.    . Multiple Vitamin (MULTIVITAMIN) tablet Take 1 tablet by mouth daily.    . norethindrone-ethinyl estradiol 1/35 (NORTREL 1/35, 21,) tablet Take 1 tablet by mouth daily. 1 Package 11  . prochlorperazine (COMPAZINE) 10 MG tablet Take 1 tablet (10 mg total) by mouth every 6 (six) hours as needed for nausea or vomiting. 30 tablet 0   No current facility-administered medications for  this visit.     SURGICAL HISTORY:  Past Surgical History:  Procedure Laterality Date  . NO PAST SURGERIES      REVIEW OF SYSTEMS:   Review of Systems  Constitutional: Negative for appetite change, chills, fatigue, fever and unexpected weight change.  HENT:   Negative for mouth sores, nosebleeds, sore throat and trouble swallowing.   Eyes: Negative for eye problems and icterus.  Respiratory: Negative for cough, hemoptysis, shortness of breath and wheezing.   Cardiovascular: Negative for chest pain and leg swelling.  Gastrointestinal: Negative for abdominal pain, constipation, diarrhea, nausea and vomiting.  Genitourinary: Negative for bladder incontinence, difficulty urinating, dysuria, frequency and hematuria.   Musculoskeletal: Negative for back pain, gait problem, neck pain and neck stiffness.  Skin: Negative for itching and rash.  Neurological: Negative for dizziness, extremity weakness, gait problem, headaches, light-headedness and seizures. Positive for the sensation of being off balance at times. Hematological: Negative for adenopathy. Does not bruise/bleed easily.  Psychiatric/Behavioral: Negative for confusion, depression and sleep disturbance. The patient is not nervous/anxious.     PHYSICAL EXAMINATION:  Blood pressure 118/82, pulse 69, temperature 98.4 F (36.9 C), temperature source Oral, resp. rate 18, height 5\' 2"  (1.575 m), weight 131 lb 8 oz (59.6 kg), SpO2 100 %.  ECOG PERFORMANCE STATUS: 0 - Asymptomatic  Physical Exam  Constitutional: Oriented to person, place, and time and well-developed, well-nourished, and in no distress. No distress.  HENT:  Head: Normocephalic and atraumatic.  Mouth/Throat: Oropharynx  is clear and moist. No oropharyngeal exudate.  Eyes: Conjunctivae are normal. Right eye exhibits no discharge. Left eye exhibits no discharge. No scleral icterus.  Neck: Normal range of motion. Neck supple.  Cardiovascular: Normal rate, regular rhythm,  normal heart sounds and intact distal pulses.   Pulmonary/Chest: Effort normal and breath sounds normal. No respiratory distress. No wheezes. No rales.  Abdominal: Soft. Bowel sounds are normal. Exhibits no distension and no mass. There is no tenderness.  Musculoskeletal: Normal range of motion. Exhibits no edema.  Lymphadenopathy:    No cervical adenopathy.  Neurological: Alert and oriented to person, place, and time. Exhibits normal muscle tone. Gait normal. Coordination normal.  Skin: Skin is warm and dry. No rash noted. Not diaphoretic. No erythema. No pallor.  Psychiatric: Mood, memory and judgment normal.  Vitals reviewed.  LABORATORY DATA: Lab Results  Component Value Date   WBC 8.7 01/30/2018   HGB 13.8 01/30/2018   HCT 41.5 01/30/2018   MCV 90.2 01/30/2018   PLT 468 (H) 01/30/2018      Chemistry      Component Value Date/Time   NA 140 01/30/2018 0817   K 4.4 01/30/2018 0817   CL 102 01/30/2018 0817   CO2 31 01/30/2018 0817   BUN 8 01/30/2018 0817   CREATININE 1.03 (H) 01/30/2018 0817      Component Value Date/Time   CALCIUM 9.8 01/30/2018 0817   ALKPHOS 54 01/30/2018 0817   AST 19 01/30/2018 0817   ALT 20 01/30/2018 0817   BILITOT 1.3 (H) 01/30/2018 0817       RADIOGRAPHIC STUDIES:  No results found.   ASSESSMENT/PLAN:  Essential thrombocythemia [facility]) This is a very pleasant 42 year old African-American female with essential thrombocythemia.  She is positive for the JAK 2 mutation.  The patient is currently on treatment with hydroxyurea 500 mg daily.  She is tolerating this medication well overall with no concerning complaints.  Labs reviewed and platelet count has declined to 468,000.  Her white blood cell count hemoglobin remained normal.  Recommend that she continue hydroxyurea 500 mg daily. Additional refills were sent to her pharmacy. The patient will follow-up in 1 month for evaluation and repeat lab work.  For the sensation of being off balance at  times, I recommend that she continue to monitor this and let us know if it worsens.  Her glucose was at 65 on lab work today.  She is already eaten since the labs were drawn.  She is asymptomatic.  I advised her that this could be part of the cause for her feeling of being off balance.  I asked her to keep track of this.  She was advised to call immediately if she has any concerning symptoms in the interval. The patient voices understanding of current disease status and treatment options and is in agreement with the current care plan.  All questions were answered. The patient knows to call the clinic with any problems, questions or concerns. We can certainly see the patient much sooner if necessary.   Orders Placed This Encounter  Procedures  . CBC with Differential (Cancer Center Only)    Standing Status:   Standing    Number of Occurrences:   12    Standing Expiration Date:   01/31/2019  . CMP ([facility] only)    Standing Status:   Standing    Number of Occurrences:   12    Standing Expiration Date:   01/31/2019  . Lactate dehydrogenase  Standing Status:   Future    Standing Expiration Date:   01/31/2019  . Lactate dehydrogenase    Standing Status:   Future    Standing Expiration Date:   01/31/2019   Mikey Bussing, DNP, AGPCNP-BC, AOCNP 01/30/18

## 2018-01-30 NOTE — Assessment & Plan Note (Signed)
This is a very pleasant 42 year old African-American female with essential thrombocythemia.  She is positive for the JAK 2 mutation.  The patient is currently on treatment with hydroxyurea 500 mg daily.  She is tolerating this medication well overall with no concerning complaints.  Labs reviewed and platelet count has declined to 468,000.  Her white blood cell count hemoglobin remained normal.  Recommend that she continue hydroxyurea 500 mg daily. Additional refills were sent to her pharmacy. The patient will follow-up in 1 month for evaluation and repeat lab work.  For the sensation of being off balance at times, I recommend that she continue to monitor this and let us know if it worsens.  Her glucose was at 65 on lab work today.  She is already eaten since the labs were drawn.  She is asymptomatic.  I advised her that this could be part of the cause for her feeling of being off balance.  I asked her to keep track of this.  She was advised to call immediately if she has any concerning symptoms in the interval. The patient voices understanding of current disease status and treatment options and is in agreement with the current care plan.  All questions were answered. The patient knows to call the clinic with any problems, questions or concerns. We can certainly see the patient much sooner if necessary.

## 2018-02-13 ENCOUNTER — Telehealth: Payer: Self-pay

## 2018-02-13 ENCOUNTER — Other Ambulatory Visit: Payer: Self-pay | Admitting: Family Medicine

## 2018-02-13 DIAGNOSIS — D473 Essential (hemorrhagic) thrombocythemia: Secondary | ICD-10-CM

## 2018-02-13 NOTE — Telephone Encounter (Signed)
Left VM for pt to let her know referral was placed as she requested.

## 2018-02-13 NOTE — Telephone Encounter (Signed)
Referral placed.

## 2018-02-13 NOTE — Telephone Encounter (Signed)
Copied from [place] [id number]. Topic: Referral - Request >> Feb 13, 2018  2:59 PM Hewitt Shorts wrote: Pt states that she is needing a referral to her oncologist mohamed mohamed  for insurance purposes    Best number is [phone]

## 2018-03-04 NOTE — Progress Notes (Signed)
[facility] OFFICE PROGRESS NOTE  [name], DO [place] [address]  DIAGNOSIS: Essential thrombocythemia diagnosed in May 2019. Positive for JAK2 mutation.  PRIOR THERAPY: None  CURRENT THERAPY: Hydroxyurea 500 mg daily. The first dose was started on 01/01/2018.  Status post 2 months of therapy.  INTERVAL HISTORY: Darlene Davis 42 y.o. female returns for routine follow-up visit by herself.  The patient is feeling fine today and has no specific complaints except for the intermittent sensation of being off balance at times.  She denies fevers and chills.  Denies chest pain, shortness of breath, cough, hemoptysis.  Denies nausea, vomiting, constipation, diarrhea.  Denies recent weight loss or night sweats.  Denies skin changes.  The patient is overall tolerating treatment with Hydrea fairly well.  She states that she took her last dose 5 days ago.  Refills were sent for her last month but she was not aware that she needed to pick more up.  The patient is here for evaluation and repeat lab work.  MEDICAL HISTORY: Past Medical History:  Diagnosis Date  . 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  MEDICATIONS:  Current Outpatient Medications  Medication Sig Dispense Refill  . acyclovir (ZOVIRAX) 400 MG tablet     . Ascorbic Acid (VITAMIN C PO) Take by mouth.    . Cholecalciferol (VITAMIN D PO) Take by mouth.    . hydroxyurea (HYDREA) 500 MG capsule Take 1 capsule (500 mg total) by mouth daily. May take with food to minimize GI side effects. 30 capsule 2  . Iron-Vitamin C (IRON 100/C) 100-250 MG TABS Take by mouth daily.    . Multiple Vitamin (MULTIVITAMIN) tablet Take 1 tablet by mouth daily.    . norethindrone-ethinyl estradiol 1/35 (NORTREL 1/35, 21,) tablet Take 1 tablet by mouth daily. 1 Package 11  . prochlorperazine (COMPAZINE) 10 MG tablet Take 1 tablet (10 mg total) by mouth every  6 (six) hours as needed for nausea or vomiting. 30 tablet 0   No current facility-administered medications for this visit.     SURGICAL HISTORY:  Past Surgical History:  Procedure Laterality Date  . NO PAST SURGERIES      REVIEW OF SYSTEMS:   Review of Systems  Constitutional: Negative for appetite change, chills, fatigue, fever and unexpected weight change.  HENT:   Negative for mouth sores, nosebleeds, sore throat and trouble swallowing.   Eyes: Negative for eye problems and icterus.  Respiratory: Negative for cough, hemoptysis, shortness of breath and wheezing.   Cardiovascular: Negative for chest pain and leg swelling.  Gastrointestinal: Negative for abdominal pain, constipation, diarrhea, nausea and vomiting.  Genitourinary: Negative for bladder incontinence, difficulty urinating, dysuria, frequency and hematuria.   Musculoskeletal: Negative for back pain, gait problem, neck pain and neck stiffness.  Skin: Negative for itching and rash.  Neurological: Negative for extremity weakness, gait problem, headaches, and seizures.  Positive for the sensation of being off balance at times.  Hematological: Negative for adenopathy. Does not bruise/bleed easily.  Psychiatric/Behavioral: Negative for confusion, depression and sleep disturbance. The patient is not nervous/anxious.     PHYSICAL EXAMINATION:  Blood pressure 136/82, pulse 81, temperature 99.1 F (37.3 C), temperature source Oral, resp. rate 17, height 5\' 2"  (1.575 m), weight 134 lb 3.2 oz (60.9 kg), SpO2 100 %.  ECOG PERFORMANCE STATUS: 0 - Asymptomatic  Physical Exam  Constitutional: Oriented to person, place,  and time and well-developed, well-nourished, and in no distress. No distress.  HENT:  Head: Normocephalic and atraumatic.  Mouth/Throat: Oropharynx is clear and moist. No oropharyngeal exudate.  Eyes: Conjunctivae are normal. Right eye exhibits no discharge. Left eye exhibits no discharge. No scleral icterus.  Neck:  Normal range of motion. Neck supple.  Cardiovascular: Normal rate, regular rhythm, normal heart sounds and intact distal pulses.   Pulmonary/Chest: Effort normal and breath sounds normal. No respiratory distress. No wheezes. No rales.  Abdominal: Soft. Bowel sounds are normal. Exhibits no distension and no mass. There is no tenderness.  Musculoskeletal: Normal range of motion. Exhibits no edema.  Lymphadenopathy:    No cervical adenopathy.  Neurological: Alert and oriented to person, place, and time. Exhibits normal muscle tone. Gait normal. Coordination normal.  Skin: Skin is warm and dry. No rash noted. Not diaphoretic. No erythema. No pallor.  Psychiatric: Mood, memory and judgment normal.  Vitals reviewed.  LABORATORY DATA: Lab Results  Component Value Date   WBC 8.0 03/05/2018   HGB 13.5 03/05/2018   HCT 40.6 03/05/2018   MCV 93.2 03/05/2018   PLT 394 03/05/2018      Chemistry      Component Value Date/Time   NA 140 01/30/2018 0817   K 4.4 01/30/2018 0817   CL 102 01/30/2018 0817   CO2 31 01/30/2018 0817   BUN 8 01/30/2018 0817   CREATININE 1.03 (H) 01/30/2018 0817      Component Value Date/Time   CALCIUM 9.8 01/30/2018 0817   ALKPHOS 54 01/30/2018 0817   AST 19 01/30/2018 0817   ALT 20 01/30/2018 0817   BILITOT 1.3 (H) 01/30/2018 0817       RADIOGRAPHIC STUDIES:  No results found.   ASSESSMENT/PLAN:  Essential thrombocythemia [facility]) This is a very pleasant 42 year old African-American femalewith essential thrombocythemia.She is positive for the JAK2 mutation. The patient is currently on treatment with hydroxyurea 500 mg daily.She is tolerating this medication well overall with no concerning complaints. Labs reviewed and platelet count has declined to 394,000. Her white blood cell count hemoglobin remain normal. Recommend that she restart Hydrea 500 mg daily.  She was advised not to stop this medication unless instructed to do so.  I have again sent  additional refills to her pharmacy to be sure that she has enough medication on file.  She will keep her follow-up as previously scheduled next month for evaluation and repeat lab work.  She was advised to call immediately if she has any concerning symptoms in the interval. The patient voices understanding of current disease status and treatment options and is in agreement with the current care plan.  All questions were answered. The patient knows to call the clinic with any problems, questions or concerns. We can certainly see the patient much sooner if necessary.   No orders of the defined types were placed in this encounter.  Mikey Bussing, DNP, AGPCNP-BC, AOCNP 03/05/18

## 2018-03-04 NOTE — Assessment & Plan Note (Addendum)
This is a very pleasant 42 year old African-American femalewith essential thrombocythemia.She is positive for the JAK2 mutation. The patient is currently on treatment with hydroxyurea 500 mg daily.She is tolerating this medication well overall with no concerning complaints. Labs reviewed and platelet count has declined to 394,000. Her white blood cell count hemoglobin remain normal. Recommend that she restart Hydrea 500 mg daily.  She was advised not to stop this medication unless instructed to do so.  I have again sent additional refills to her pharmacy to be sure that she has enough medication on file.  She will keep her follow-up as previously scheduled next month for evaluation and repeat lab work.  She was advised to call immediately if she has any concerning symptoms in the interval. The patient voices understanding of current disease status and treatment options and is in agreement with the current care plan.  All questions were answered. The patient knows to call the clinic with any problems, questions or concerns. We can certainly see the patient much sooner if necessary.

## 2018-03-05 ENCOUNTER — Telehealth: Payer: Self-pay

## 2018-03-05 ENCOUNTER — Inpatient Hospital Stay: Payer: 59 | Attending: Internal Medicine

## 2018-03-05 ENCOUNTER — Inpatient Hospital Stay: Payer: 59 | Admitting: Oncology

## 2018-03-05 ENCOUNTER — Encounter: Payer: Self-pay | Admitting: Oncology

## 2018-03-05 ENCOUNTER — Telehealth: Payer: Self-pay | Admitting: Oncology

## 2018-03-05 VITALS — BP 136/82 | HR 81 | Temp 99.1°F | Resp 17 | Ht 62.0 in | Wt 134.2 lb

## 2018-03-05 DIAGNOSIS — D473 Essential (hemorrhagic) thrombocythemia: Secondary | ICD-10-CM

## 2018-03-05 DIAGNOSIS — Z79899 Other long term (current) drug therapy: Secondary | ICD-10-CM | POA: Insufficient documentation

## 2018-03-05 LAB — CMP (CANCER CENTER ONLY)
ALBUMIN: 3.9 g/dL (ref 3.5–5.0)
ALT: 283 U/L — AB (ref 0–44)
ANION GAP: 9 (ref 5–15)
AST: 853 U/L (ref 15–41)
Alkaline Phosphatase: 52 U/L (ref 38–126)
BILIRUBIN TOTAL: 1.6 mg/dL — AB (ref 0.3–1.2)
BUN: 8 mg/dL (ref 6–20)
CO2: 29 mmol/L (ref 22–32)
Calcium: 9.5 mg/dL (ref 8.9–10.3)
Chloride: 100 mmol/L (ref 98–111)
Creatinine: 0.93 mg/dL (ref 0.44–1.00)
GFR, Est AFR Am: >60 mL/min (ref >60)
GFR, Estimated: >60 mL/min (ref >60)
Glucose, Bld: 122 mg/dL — ABNORMAL HIGH (ref 70–99)
Potassium: 3.9 mmol/L (ref 3.5–5.1)
Sodium: 138 mmol/L (ref 135–145)
Total Protein: 7.7 g/dL (ref 6.5–8.1)

## 2018-03-05 LAB — CBC WITH DIFFERENTIAL (CANCER CENTER ONLY)
BASOS PCT: 0 %
Basophils Absolute: 0 10*3/uL (ref 0.0–0.1)
Eosinophils Absolute: 0.1 10*3/uL (ref 0.0–0.5)
Eosinophils Relative: 1 %
HCT: 40.6 % (ref 34.8–46.6)
HEMOGLOBIN: 13.5 g/dL (ref 11.6–15.9)
LYMPHS ABS: 2.3 10*3/uL (ref 0.9–3.3)
Lymphocytes Relative: 29 %
MCH: 31 pg (ref 25.1–34.0)
MCHC: 33.2 g/dL (ref 31.5–36.0)
MCV: 93.2 fL (ref 79.5–101.0)
MONOS PCT: 9 %
Monocytes Absolute: 0.7 10*3/uL (ref 0.1–0.9)
NEUTROS ABS: 4.9 10*3/uL (ref 1.5–6.5)
NEUTROS PCT: 61 %
Platelet Count: 394 10*3/uL (ref 145–400)
RBC: 4.35 MIL/uL (ref 3.70–5.45)
RDW: 17.1 % — ABNORMAL HIGH (ref 11.2–14.5)
WBC Count: 8 10*3/uL (ref 3.9–10.3)

## 2018-03-05 LAB — LACTATE DEHYDROGENASE: LDH: 1967 U/L — ABNORMAL HIGH (ref 98–192)

## 2018-03-05 MED ORDER — HYDROXYUREA 500 MG PO CAPS: 500.0000 mg | ORAL_CAPSULE | Freq: Every day | ORAL | 2 refills | Status: DC

## 2018-03-05 NOTE — Telephone Encounter (Signed)
I attempted to contact the patient this afternoon regarding her elevated liver enzymes.  I left a voicemail at both her mobile and home number.  I have asked her not to take her hydroxyurea until further notice.  I will review her lab work with Dr. Julien Nordmann upon his return tomorrow and we will contact her with further instructions.  The cause of her elevated liver enzymes is not entirely clear at this time.

## 2018-03-05 NOTE — Telephone Encounter (Signed)
Received critical AST 853 and ALT 283 which was communicated to Mikey Bussing NP.

## 2018-03-06 ENCOUNTER — Other Ambulatory Visit: Payer: Self-pay | Admitting: Oncology

## 2018-03-06 ENCOUNTER — Telehealth: Payer: Self-pay | Admitting: Oncology

## 2018-03-06 ENCOUNTER — Telehealth: Payer: Self-pay | Admitting: Internal Medicine

## 2018-03-06 DIAGNOSIS — R7989 Other specified abnormal findings of blood chemistry: Secondary | ICD-10-CM

## 2018-03-06 DIAGNOSIS — R945 Abnormal results of liver function studies: Principal | ICD-10-CM

## 2018-03-06 NOTE — Progress Notes (Signed)
I was able to talk to the patient regarding her lab work from yesterday.  Her liver enzymes are elevated.  She denies any new medications or herbal supplements.  Denies Tylenol alcohol use.  Advised her that we would recheck her lab work tomorrow morning.  We will schedule her for an ultrasound of the abdomen within the next few days.  She will follow-up with [place] on 03/14/2018.  Scheduling messages have been sent.

## 2018-03-06 NOTE — Telephone Encounter (Signed)
Received call from patient. Per patient she is returning a call to Dr. Worthy Flank assistant Cyril Mourning who left a message for her to call the office asap. Per patient was to call in and ask for Kristen's nurse who would get her directly to [place].    Not able to reach [facility] nurse. Patient can be reached at [phone].

## 2018-03-06 NOTE — Telephone Encounter (Signed)
Called pt re lab appt for tomorrow per 8/1 sch msg - left vm for pt re appt.

## 2018-03-06 NOTE — Telephone Encounter (Signed)
Scheduled appt per 8/1 sch message - patient is aware of appt date and time.

## 2018-03-07 ENCOUNTER — Other Ambulatory Visit: Payer: 59

## 2018-03-07 ENCOUNTER — Telehealth: Payer: Self-pay | Admitting: Internal Medicine

## 2018-03-07 NOTE — Telephone Encounter (Signed)
Called pt to reschedule appointments - left vm for her to call back and schedule her lab.

## 2018-03-07 NOTE — Telephone Encounter (Signed)
Any thoughts? 

## 2018-03-07 NOTE — Telephone Encounter (Signed)
Referral placed on 7/11. Not sure what else this is requiring.

## 2018-03-07 NOTE — Telephone Encounter (Signed)
Abigail Butts from [facility] is calling and the referral to dr Curt Bears NPI [phone]  need to be put in provider portal . Please call help desk if assistant is needed [phone]. [facility] took  care of the referral on  02/13/18

## 2018-03-11 ENCOUNTER — Ambulatory Visit (HOSPITAL_COMMUNITY)
Admission: RE | Admit: 2018-03-11 | Discharge: 2018-03-11 | Disposition: A | Discharge status: Home / Self Care | Payer: 59 | Source: Ambulatory Visit | Attending: Oncology | Admitting: Oncology

## 2018-03-11 DIAGNOSIS — R945 Abnormal results of liver function studies: Secondary | ICD-10-CM | POA: Insufficient documentation

## 2018-03-11 DIAGNOSIS — R7989 Other specified abnormal findings of blood chemistry: Secondary | ICD-10-CM

## 2018-03-13 ENCOUNTER — Other Ambulatory Visit: Payer: Self-pay | Admitting: Oncology

## 2018-03-13 DIAGNOSIS — R7989 Other specified abnormal findings of blood chemistry: Secondary | ICD-10-CM

## 2018-03-13 DIAGNOSIS — R945 Abnormal results of liver function studies: Principal | ICD-10-CM

## 2018-03-14 ENCOUNTER — Inpatient Hospital Stay: Payer: 59

## 2018-03-14 ENCOUNTER — Encounter: Payer: Self-pay | Admitting: Oncology

## 2018-03-14 ENCOUNTER — Inpatient Hospital Stay: Payer: 59 | Attending: Internal Medicine | Admitting: Oncology

## 2018-03-14 ENCOUNTER — Telehealth: Payer: Self-pay | Admitting: Internal Medicine

## 2018-03-14 VITALS — BP 137/74 | HR 72 | Temp 98.4°F | Resp 18 | Ht 62.0 in | Wt 131.6 lb

## 2018-03-14 DIAGNOSIS — D473 Essential (hemorrhagic) thrombocythemia: Secondary | ICD-10-CM | POA: Diagnosis present

## 2018-03-14 DIAGNOSIS — R945 Abnormal results of liver function studies: Principal | ICD-10-CM

## 2018-03-14 DIAGNOSIS — Z79899 Other long term (current) drug therapy: Secondary | ICD-10-CM | POA: Diagnosis not present

## 2018-03-14 DIAGNOSIS — R748 Abnormal levels of other serum enzymes: Secondary | ICD-10-CM | POA: Diagnosis not present

## 2018-03-14 DIAGNOSIS — R7989 Other specified abnormal findings of blood chemistry: Secondary | ICD-10-CM

## 2018-03-14 LAB — CBC WITH DIFFERENTIAL (CANCER CENTER ONLY)
BASOS ABS: 0.1 10*3/uL (ref 0.0–0.1)
BASOS PCT: 1 %
EOS ABS: 0.1 10*3/uL (ref 0.0–0.5)
EOS PCT: 2 %
HCT: 42.1 % (ref 34.8–46.6)
Hemoglobin: 14 g/dL (ref 11.6–15.9)
Lymphocytes Relative: 25 %
Lymphs Abs: 1.8 10*3/uL (ref 0.9–3.3)
MCH: 31.1 pg (ref 25.1–34.0)
MCHC: 33.4 g/dL (ref 31.5–36.0)
MCV: 93.2 fL (ref 79.5–101.0)
MONO ABS: 0.7 10*3/uL (ref 0.1–0.9)
Monocytes Relative: 10 %
Neutro Abs: 4.5 10*3/uL (ref 1.5–6.5)
Neutrophils Relative %: 62 %
PLATELETS: 420 10*3/uL — AB (ref 145–400)
RBC: 4.51 MIL/uL (ref 3.70–5.45)
RDW: 16.4 % — AB (ref 11.2–14.5)
WBC Count: 7.1 10*3/uL (ref 3.9–10.3)

## 2018-03-14 LAB — LACTATE DEHYDROGENASE: LDH: 188 U/L (ref 98–192)

## 2018-03-14 LAB — CMP (CANCER CENTER ONLY)
ALBUMIN: 3.9 g/dL (ref 3.5–5.0)
ALK PHOS: 56 U/L (ref 38–126)
ALT: 78 U/L — AB (ref 0–44)
AST: 33 U/L (ref 15–41)
Anion gap: 10 (ref 5–15)
BILIRUBIN TOTAL: 1.4 mg/dL — AB (ref 0.3–1.2)
BUN: 9 mg/dL (ref 6–20)
CALCIUM: 9 mg/dL (ref 8.9–10.3)
CO2: 27 mmol/L (ref 22–32)
CREATININE: 0.99 mg/dL (ref 0.44–1.00)
Chloride: 100 mmol/L (ref 98–111)
GFR, Est AFR Am: >60 mL/min (ref >60)
GLUCOSE: 82 mg/dL (ref 70–99)
Potassium: 4.1 mmol/L (ref 3.5–5.1)
Sodium: 137 mmol/L (ref 135–145)
TOTAL PROTEIN: 7.5 g/dL (ref 6.5–8.1)

## 2018-03-14 LAB — GAMMA GT: GGT: 91 U/L — AB (ref 7–50)

## 2018-03-14 NOTE — Progress Notes (Signed)
[facility] OFFICE PROGRESS NOTE  [name], DO [place] [address]  [place] thrombocythemia diagnosed in May 2019. Positive for JAK2 mutation.  PRIOR THERAPY:None  CURRENT THERAPY:Hydroxyurea 500 mg daily. The first dose was started on 01/01/2018.Status post 2 months of therapy.  Her treatment was placed on hold on 03/05/2018 secondary to elevated liver enzymes.  She will resume treatment with hydroxyurea 500 mg daily on 03/14/2018.  INTERVAL HISTORY: Darlene Davis 42 y.o. female returns for routine follow-up visit by herself.  The patient is feeling fine today and has no specific complaints.  She remains Droxia urea secondary to elevated liver enzymes.  She has not noticed any jaundice.  She denies fevers and chills.  Denies chest pain, shortness of breath, cough, hemoptysis.  Denies abdominal pain, nausea, vomiting, constipation, diarrhea.  Denies recent weight loss or night sweats.  She takes several vitamins but no other herbal supplements.  Denies use of Tylenol.  Denies alcohol intake.  The patient had a recent ultrasound of the abdomen and is here to discuss the results as well as for evaluation and repeat lab work.  MEDICAL HISTORY: Past Medical History:  Diagnosis Date  . 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  MEDICATIONS:  Current Outpatient Medications  Medication Sig Dispense Refill  . acyclovir (ZOVIRAX) 400 MG tablet     . Ascorbic Acid (VITAMIN C PO) Take by mouth.    . Cholecalciferol (VITAMIN D PO) Take by mouth.    . Multiple Vitamin (MULTIVITAMIN) tablet Take 1 tablet by mouth daily.    . Multiple Vitamins-Iron (MULTIVITAMINS WITH IRON) TABS tablet Take 1 tablet by mouth daily.    . norethindrone-ethinyl estradiol 1/35 (NORTREL 1/35, 21,) tablet Take 1 tablet by mouth daily. 1 Package 11  . SPRINTEC 28 0.25-35 MG-MCG tablet Take 1 tablet by mouth  daily.    . hydroxyurea (HYDREA) 500 MG capsule Take 1 capsule (500 mg total) by mouth daily. May take with food to minimize GI side effects. (Patient not taking: Reported on 03/14/2018) 30 capsule 2  . prochlorperazine (COMPAZINE) 10 MG tablet Take 1 tablet (10 mg total) by mouth every 6 (six) hours as needed for nausea or vomiting. (Patient not taking: Reported on 03/14/2018) 30 tablet 0   No current facility-administered medications for this visit.     SURGICAL HISTORY:  Past Surgical History:  Procedure Laterality Date  . NO PAST SURGERIES      REVIEW OF SYSTEMS:   Review of Systems  Constitutional: Negative for appetite change, chills, fatigue, fever and unexpected weight change.  HENT:   Negative for mouth sores, nosebleeds, sore throat and trouble swallowing.   Eyes: Negative for eye problems and icterus.  Respiratory: Negative for cough, hemoptysis, shortness of breath and wheezing.   Cardiovascular: Negative for chest pain and leg swelling.  Gastrointestinal: Negative for abdominal pain, constipation, diarrhea, nausea and vomiting.  Genitourinary: Negative for bladder incontinence, difficulty urinating, dysuria, frequency and hematuria.   Musculoskeletal: Negative for back pain, gait problem, neck pain and neck stiffness.  Skin: Negative for itching and rash.  Neurological: Negative for dizziness, extremity weakness, gait problem, headaches, light-headedness and seizures.  Hematological: Negative for adenopathy. Does not bruise/bleed easily.  Psychiatric/Behavioral: Negative for confusion, depression and sleep disturbance. The patient is not nervous/anxious.     PHYSICAL EXAMINATION:  Blood pressure 137/74, pulse 72, temperature 98.4 F (36.9 C), temperature  source Oral, resp. rate 18, height 5\' 2"  (1.575 m), weight 131 lb 9.6 oz (59.7 kg), SpO2 100 %.  ECOG PERFORMANCE STATUS: 0 - Asymptomatic  Physical Exam  Constitutional: Oriented to person, place, and time and  well-developed, well-nourished, and in no distress. No distress.  HENT:  Head: Normocephalic and atraumatic.  Mouth/Throat: Oropharynx is clear and moist. No oropharyngeal exudate.  Eyes: Conjunctivae are normal. Right eye exhibits no discharge. Left eye exhibits no discharge. No scleral icterus.  Neck: Normal range of motion. Neck supple.  Cardiovascular: Normal rate, regular rhythm, normal heart sounds and intact distal pulses.   Pulmonary/Chest: Effort normal and breath sounds normal. No respiratory distress. No wheezes. No rales.  Abdominal: Soft. Bowel sounds are normal. Exhibits no distension and no mass. There is no tenderness.  Musculoskeletal: Normal range of motion. Exhibits no edema.  Lymphadenopathy:    No cervical adenopathy.  Neurological: Alert and oriented to person, place, and time. Exhibits normal muscle tone. Gait normal. Coordination normal.  Skin: Skin is warm and dry. No rash noted. Not diaphoretic. No erythema. No pallor.  Psychiatric: Mood, memory and judgment normal.  Vitals reviewed.  LABORATORY DATA: Lab Results  Component Value Date   WBC 7.1 03/14/2018   HGB 14.0 03/14/2018   HCT 42.1 03/14/2018   MCV 93.2 03/14/2018   PLT 420 (H) 03/14/2018      Chemistry      Component Value Date/Time   NA 137 03/14/2018 0907   K 4.1 03/14/2018 0907   CL 100 03/14/2018 0907   CO2 27 03/14/2018 0907   BUN 9 03/14/2018 0907   CREATININE 0.99 03/14/2018 0907      Component Value Date/Time   CALCIUM 9.0 03/14/2018 0907   ALKPHOS 56 03/14/2018 0907   AST 33 03/14/2018 0907   ALT 78 (H) 03/14/2018 0907   BILITOT 1.4 (H) 03/14/2018 0907     LDH 188  RADIOGRAPHIC STUDIES:  US Abdomen Complete  Result Date: 03/11/2018 CLINICAL DATA:  Elevated liver enzymes EXAM: ABDOMEN ULTRASOUND COMPLETE COMPARISON:  None. FINDINGS: Gallbladder: No gallstones or wall thickening visualized. There is no pericholecystic fluid. No sonographic Murphy sign noted by sonographer.  Common bile duct: Diameter: 2 mm. No intrahepatic, common hepatic, common bile duct dilatation. Liver: No focal lesion identified. Within normal limits in parenchymal echogenicity. Portal vein is patent on color Doppler imaging with normal direction of blood flow towards the liver. IVC: No abnormality visualized. Pancreas: No pancreatic mass or inflammatory focus. Spleen: Size and appearance within normal limits. Small accessory spleen noted medially. Right Kidney: Length: 10.5 cm. Echogenicity within normal limits. No mass or hydronephrosis visualized. Left Kidney: Length: 10.0 cm. Echogenicity within normal limits. No mass or hydronephrosis visualized. Abdominal aorta: No aneurysm visualized. Other findings: No demonstrable ascites. IMPRESSION: Study within normal limits. Electronically Signed   By: Lowella Grip III M.D.   On: 03/11/2018 09:13     ASSESSMENT/PLAN:  Essential thrombocythemia [facility]) This is a very pleasant 42 year old African-American femalewith essential thrombocythemia.She is positive for the JAK2 mutation. The patient started on treatment with hydroxyurea 500 mg daily.She completed 2 months of treatment which she tolerated well overall but then developed elevated liver enzymes and elevated LDH level.  Her treatment was placed on hold.  She had an ultrasound of the abdomen is here for repeat lab work.  She was seen with Dr. Julien Nordmann.  Lab results reviewed which showed that her platelet count is rising due to her being off medication.  Is up to 420,000 today.  Her liver enzymes have improved.  Her AST has now normalized and her ALT has dropped to 78.  LDH has normalized.  Her liver ultrasound did not show any abnormalities.  Hepatitis panel and GGT are pending.  Discussed with the patient that the elevated liver enzymes could be related to her hydroxyurea, but it is not entirely clear.  Recommend that she resume treatment with hydroxyurea 500 mg daily today.  We will recheck her  labs on 03/19/2018 and monitor closely.  If her liver enzymes start to rise again, we will discontinue hydroxyurea.  The patient will keep her follow-up visit at the end of August for evaluation and repeat lab work as previously scheduled.   She was advised to call immediately if she has any concerning symptoms in the interval. The patient voices understanding of current disease status and treatment options and is in agreement with the current care plan.  All questions were answered. The patient knows to call the clinic with any problems, questions or concerns. We can certainly see the patient much sooner if necessary.   Orders Placed This Encounter  Procedures  . CBC with Differential (Cancer Center Only)    Standing Status:   Future    Standing Expiration Date:   03/15/2019  . CMP ([facility] only)    Standing Status:   Future    Standing Expiration Date:   03/15/2019  . Lactate dehydrogenase    Standing Status:   Future    Standing Expiration Date:   03/15/2019  . CBC with Differential (Cancer Center Only)    Standing Status:   Future    Standing Expiration Date:   03/15/2019  . CMP ([facility] only)    Standing Status:   Future    Standing Expiration Date:   03/15/2019     Mikey Bussing, DNP, AGPCNP-BC, AOCNP 03/14/18   ADDENDUM: Hematology/Oncology Attending: I had a face-to-face encounter with the patient.  I recommended her care plan.  This is a very pleasant 42 years old African-American female who was diagnosed with essential thrombocythemia with positive Jak 2 mutation.  The patient was a started on treatment with hydroxyurea 500 mg p.o. daily status post 2 months of treatment but this was placed on hold after the patient was found on previous blood work to have significant elevation of her liver enzymes.  She was taken of the medication for the last 2 weeks. She is feeling fine with no concerning complaints.  Repeat blood work today showed mild elevation of her platelets  count but there was significant improvement of her liver enzymes. I recommended for the patient to resume her treatment with hydroxyurea but we will monitor her very closely and will have repeat CBC and comprehensive metabolic panel early next week for close monitoring of her liver enzymes and platelets count. If the patient had any further elevation or deterioration of her liver enzymes, we will definitely discontinue her treatment with hydroxyurea permanently.  She will come back for follow-up visit in few weeks for reevaluation again with repeat blood work. The patient was advised to call immediately if she has any concerning symptoms in the interval.  Disclaimer: This note was dictated with voice recognition software. Similar sounding words can inadvertently be transcribed and may be missed upon review. Eilleen Kempf, MD 03/15/18

## 2018-03-14 NOTE — Assessment & Plan Note (Signed)
This is a very pleasant 42 year old African-American femalewith essential thrombocythemia.She is positive for the JAK2 mutation. The patient started on treatment with hydroxyurea 500 mg daily.She completed 2 months of treatment which she tolerated well overall but then developed elevated liver enzymes and elevated LDH level.  Her treatment was placed on hold.  She had an ultrasound of the abdomen is here for repeat lab work.  She was seen with Dr. Julien Nordmann.  Lab results reviewed which showed that her platelet count is rising due to her being off medication.  Is up to 420,000 today.  Her liver enzymes have improved.  Her AST has now normalized and her ALT has dropped to 78.  LDH has normalized.  Her liver ultrasound did not show any abnormalities.  Hepatitis panel and GGT are pending.  Discussed with the patient that the elevated liver enzymes could be related to her hydroxyurea, but it is not entirely clear.  Recommend that she resume treatment with hydroxyurea 500 mg daily today.  We will recheck her labs on 03/19/2018 and monitor closely.  If her liver enzymes start to rise again, we will discontinue hydroxyurea.  The patient will keep her follow-up visit at the end of August for evaluation and repeat lab work as previously scheduled.   She was advised to call immediately if she has any concerning symptoms in the interval. The patient voices understanding of current disease status and treatment options and is in agreement with the current care plan.  All questions were answered. The patient knows to call the clinic with any problems, questions or concerns. We can certainly see the patient much sooner if necessary.

## 2018-03-14 NOTE — Telephone Encounter (Signed)
Appt scheduled pt declined AVS/[place] per 8/9 los

## 2018-03-15 LAB — HEPATITIS B SURFACE ANTIGEN: HEP B S AG: NEGATIVE

## 2018-03-15 LAB — HEPATITIS PANEL, ACUTE
HEP B S AG: NEGATIVE
Hep A IgM: NEGATIVE
Hep B C IgM: NEGATIVE

## 2018-03-19 ENCOUNTER — Telehealth: Payer: Self-pay

## 2018-03-19 ENCOUNTER — Inpatient Hospital Stay: Payer: 59

## 2018-03-19 DIAGNOSIS — R945 Abnormal results of liver function studies: Secondary | ICD-10-CM

## 2018-03-19 DIAGNOSIS — D473 Essential (hemorrhagic) thrombocythemia: Secondary | ICD-10-CM | POA: Diagnosis not present

## 2018-03-19 DIAGNOSIS — R7989 Other specified abnormal findings of blood chemistry: Secondary | ICD-10-CM

## 2018-03-19 LAB — CBC WITH DIFFERENTIAL (CANCER CENTER ONLY)
BASOS PCT: 0 %
Basophils Absolute: 0 10*3/uL (ref 0.0–0.1)
EOS ABS: 0.1 10*3/uL (ref 0.0–0.5)
Eosinophils Relative: 1 %
HCT: 38.5 % (ref 34.8–46.6)
Hemoglobin: 12.8 g/dL (ref 11.6–15.9)
Lymphocytes Relative: 39 %
Lymphs Abs: 2.6 10*3/uL (ref 0.9–3.3)
MCH: 31 pg (ref 25.1–34.0)
MCHC: 33.2 g/dL (ref 31.5–36.0)
MCV: 93.2 fL (ref 79.5–101.0)
MONO ABS: 0.8 10*3/uL (ref 0.1–0.9)
Monocytes Relative: 12 %
Neutro Abs: 3.2 10*3/uL (ref 1.5–6.5)
Neutrophils Relative %: 48 %
Platelet Count: 387 10*3/uL (ref 145–400)
RBC: 4.13 MIL/uL (ref 3.70–5.45)
RDW: 15.3 % — AB (ref 11.2–14.5)
WBC Count: 6.8 10*3/uL (ref 3.9–10.3)

## 2018-03-19 LAB — CMP (CANCER CENTER ONLY)
ALBUMIN: 3.7 g/dL (ref 3.5–5.0)
ALT: 45 U/L — ABNORMAL HIGH (ref 0–44)
ANION GAP: 10 (ref 5–15)
AST: 38 U/L (ref 15–41)
Alkaline Phosphatase: 50 U/L (ref 38–126)
BUN: 6 mg/dL (ref 6–20)
CO2: 26 mmol/L (ref 22–32)
Calcium: 8.7 mg/dL — ABNORMAL LOW (ref 8.9–10.3)
Chloride: 105 mmol/L (ref 98–111)
Creatinine: 0.97 mg/dL (ref 0.44–1.00)
GFR, Estimated: >60 mL/min (ref >60)
GLUCOSE: 99 mg/dL (ref 70–99)
POTASSIUM: 4 mmol/L (ref 3.5–5.1)
SODIUM: 141 mmol/L (ref 135–145)
TOTAL PROTEIN: 7 g/dL (ref 6.5–8.1)
Total Bilirubin: 1.3 mg/dL — ABNORMAL HIGH (ref 0.3–1.2)

## 2018-03-19 LAB — LACTATE DEHYDROGENASE: LDH: 236 U/L — ABNORMAL HIGH (ref 98–192)

## 2018-03-19 NOTE — Telephone Encounter (Signed)
Contacted patient regarding follow up lab appointment. Spoke with Dr. Julien Nordmann and patient is to continue on the [facility] daily and then follow up in 2 weeks for lab and MD appointment. Communicated labs, to remain on the Hydroxyurea daily, and that she has a follow up appointment on 8/27 for labs and MD appointment. Patient verbalized understanding and had no other questions or concerns.

## 2018-04-01 ENCOUNTER — Telehealth: Payer: Self-pay | Admitting: Internal Medicine

## 2018-04-01 ENCOUNTER — Inpatient Hospital Stay (HOSPITAL_BASED_OUTPATIENT_CLINIC_OR_DEPARTMENT_OTHER): Payer: 59 | Admitting: Internal Medicine

## 2018-04-01 ENCOUNTER — Encounter: Payer: Self-pay | Admitting: Internal Medicine

## 2018-04-01 ENCOUNTER — Inpatient Hospital Stay: Payer: 59

## 2018-04-01 VITALS — BP 126/89 | HR 75 | Temp 99.0°F | Resp 17 | Ht 62.0 in | Wt 131.6 lb

## 2018-04-01 DIAGNOSIS — Z79899 Other long term (current) drug therapy: Secondary | ICD-10-CM | POA: Diagnosis not present

## 2018-04-01 DIAGNOSIS — D473 Essential (hemorrhagic) thrombocythemia: Secondary | ICD-10-CM

## 2018-04-01 LAB — CBC WITH DIFFERENTIAL (CANCER CENTER ONLY)
Basophils Absolute: 0.1 10*3/uL (ref 0.0–0.1)
Basophils Relative: 1 %
EOS PCT: 2 %
Eosinophils Absolute: 0.1 10*3/uL (ref 0.0–0.5)
HEMATOCRIT: 42.1 % (ref 34.8–46.6)
Hemoglobin: 14.1 g/dL (ref 11.6–15.9)
LYMPHS ABS: 2.4 10*3/uL (ref 0.9–3.3)
Lymphocytes Relative: 42 %
MCH: 31.7 pg (ref 25.1–34.0)
MCHC: 33.6 g/dL (ref 31.5–36.0)
MCV: 94.6 fL (ref 79.5–101.0)
MONO ABS: 0.6 10*3/uL (ref 0.1–0.9)
Monocytes Relative: 10 %
Neutro Abs: 2.6 10*3/uL (ref 1.5–6.5)
Neutrophils Relative %: 45 %
PLATELETS: 409 10*3/uL — AB (ref 145–400)
RBC: 4.45 MIL/uL (ref 3.70–5.45)
RDW: 15.5 % — ABNORMAL HIGH (ref 11.2–14.5)
WBC Count: 5.8 10*3/uL (ref 3.9–10.3)

## 2018-04-01 LAB — CMP (CANCER CENTER ONLY)
ALT: 26 U/L (ref 0–44)
AST: 22 U/L (ref 15–41)
Albumin: 3.9 g/dL (ref 3.5–5.0)
Alkaline Phosphatase: 60 U/L (ref 38–126)
Anion gap: 10 (ref 5–15)
BILIRUBIN TOTAL: 0.9 mg/dL (ref 0.3–1.2)
BUN: 8 mg/dL (ref 6–20)
CALCIUM: 9.7 mg/dL (ref 8.9–10.3)
CO2: 28 mmol/L (ref 22–32)
Chloride: 104 mmol/L (ref 98–111)
Creatinine: 1.07 mg/dL — ABNORMAL HIGH (ref 0.44–1.00)
Glucose, Bld: 92 mg/dL (ref 70–99)
Potassium: 4 mmol/L (ref 3.5–5.1)
Sodium: 142 mmol/L (ref 135–145)
TOTAL PROTEIN: 7.5 g/dL (ref 6.5–8.1)

## 2018-04-01 LAB — LACTATE DEHYDROGENASE: LDH: 176 U/L (ref 98–192)

## 2018-04-01 NOTE — Telephone Encounter (Signed)
Appts scheduled AVS/Calendar printed per 8/27 los °

## 2018-04-01 NOTE — Progress Notes (Signed)
[facility] Telephone:([phone]) [phone]   Fax:(336) [phone]  OFFICE PROGRESS NOTE  Darlene Nutting, Darlene Davis [place] [address]  DIAGNOSIS:Essential thrombocythemia diagnosed in May 2019. Positive for JAK2 mutation.  PRIOR THERAPY:None  CURRENT THERAPY:Hydroxyurea 500 mg daily. The first dose was started on 01/01/2018.Status post3monthsof therapy.  Her treatment was placed on hold on 03/05/2018 secondary to elevated liver enzymes.  She will resume treatment with hydroxyurea 500 mg daily on 03/14/2018.  INTERVAL HISTORY: Darlene Davis 42 y.o. female returns to the clinic today for follow-up visit.  The patient is feeling fine today with no specific complaints.  She denied having any chest pain, shortness breath, cough or hemoptysis.  She denied having any fever or chills.  She has no nausea, vomiting, diarrhea or constipation.  She has mild fatigue.  She denied having any significant weight loss or night sweats.  She is currently on hydroxyurea and tolerating it well.  She is here for evaluation and repeat blood work and close monitoring of her liver enzymes.  MEDICAL HISTORY: Past Medical History:  Diagnosis Date  . 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  MEDICATIONS:  Current Outpatient Medications  Medication Sig Dispense Refill  . acyclovir (ZOVIRAX) 400 MG tablet     . Ascorbic Acid (VITAMIN C PO) Take by mouth.    . Cholecalciferol (VITAMIN D PO) Take by mouth.    . hydroxyurea (HYDREA) 500 MG capsule Take 1 capsule (500 mg total) by mouth daily. May take with food to minimize GI side effects. (Patient not taking: Reported on 03/14/2018) 30 capsule 2  . Multiple Vitamin (MULTIVITAMIN) tablet Take 1 tablet by mouth daily.    . Multiple Vitamins-Iron (MULTIVITAMINS WITH IRON) TABS tablet Take 1 tablet by mouth daily.    . norethindrone-ethinyl estradiol 1/35 (NORTREL 1/35, 21,)  tablet Take 1 tablet by mouth daily. 1 Package 11  . prochlorperazine (COMPAZINE) 10 MG tablet Take 1 tablet (10 mg total) by mouth every 6 (six) hours as needed for nausea or vomiting. (Patient not taking: Reported on 03/14/2018) 30 tablet 0  . SPRINTEC 28 0.25-35 MG-MCG tablet Take 1 tablet by mouth daily.     No current facility-administered medications for this visit.     SURGICAL HISTORY:  Past Surgical History:  Procedure Laterality Date  . NO PAST SURGERIES      REVIEW OF SYSTEMS:  A comprehensive review of systems was negative except for: Constitutional: positive for fatigue   PHYSICAL EXAMINATION: General appearance: alert, cooperative and no distress Head: Normocephalic, without obvious abnormality, atraumatic Neck: no adenopathy, no JVD, supple, symmetrical, trachea midline and thyroid not enlarged, symmetric, no tenderness/mass/nodules Lymph nodes: Cervical, supraclavicular, and axillary nodes normal. Resp: clear to auscultation bilaterally Back: symmetric, no curvature. ROM normal. No CVA tenderness. Cardio: regular rate and rhythm, S1, S2 normal, no murmur, click, rub or gallop GI: soft, non-tender; bowel sounds normal; no masses,  no organomegaly Extremities: extremities normal, atraumatic, no cyanosis or edema  ECOG PERFORMANCE STATUS: 0 - Asymptomatic  Blood pressure 126/89, pulse 75, temperature 99 F (37.2 C), temperature source Oral, resp. rate 17, height 5\' 2"  (1.575 m), weight 131 lb 9.6 oz (59.7 kg), SpO2 100 %.  LABORATORY DATA: Lab Results  Component Value Date   WBC 5.8 04/01/2018   HGB 14.1 04/01/2018   HCT 42.1 04/01/2018   MCV 94.6 04/01/2018   PLT  409 (H) 04/01/2018      Chemistry      Component Value Date/Time   NA 141 03/19/2018 0810   K 4.0 03/19/2018 0810   CL 105 03/19/2018 0810   CO2 26 03/19/2018 0810   BUN 6 03/19/2018 0810   CREATININE 0.97 03/19/2018 0810      Component Value Date/Time   CALCIUM 8.7 (L) 03/19/2018 0810    ALKPHOS 50 03/19/2018 0810   AST 38 03/19/2018 0810   ALT 45 (H) 03/19/2018 0810   BILITOT 1.3 (H) 03/19/2018 0810       RADIOGRAPHIC STUDIES: US Abdomen Complete  Result Date: 03/11/2018 CLINICAL DATA:  Elevated liver enzymes EXAM: ABDOMEN ULTRASOUND COMPLETE COMPARISON:  None. FINDINGS: Gallbladder: No gallstones or wall thickening visualized. There is no pericholecystic fluid. No sonographic Murphy sign noted by sonographer. Common bile duct: Diameter: 2 mm. No intrahepatic, common hepatic, common bile duct dilatation. Liver: No focal lesion identified. Within normal limits in parenchymal echogenicity. Portal vein is patent on color Doppler imaging with normal direction of blood flow towards the liver. IVC: No abnormality visualized. Pancreas: No pancreatic mass or inflammatory focus. Spleen: Size and appearance within normal limits. Small accessory spleen noted medially. Right Kidney: Length: 10.5 cm. Echogenicity within normal limits. No mass or hydronephrosis visualized. Left Kidney: Length: 10.0 cm. Echogenicity within normal limits. No mass or hydronephrosis visualized. Abdominal aorta: No aneurysm visualized. Other findings: No demonstrable ascites. IMPRESSION: Study within normal limits. Electronically Signed   By: Lowella Grip III M.D.   On: 03/11/2018 09:13    ASSESSMENT AND PLAN: This is a very pleasant 42 years old African-American female with essential thrombocythemia and currently on treatment with hydroxyurea 500 mg p.o. daily.  She has been tolerating this treatment well but she has one episode of elevated liver enzyme in the past which significantly improved and the patient is back to her normal range.  The comprehensive metabolic panel performed earlier today is a still pending.  CBC today showed slightly elevated platelets count of 409,000. I recommended for the patient to continue her current treatment with hydroxyurea. We will call her if the comprehensive metabolic panel  showed any significant hepatic abnormality. She will come back for follow-up visit in 1 month for reevaluation and repeat blood work. She was advised to call immediately if she has any concerning symptoms in the interval. The patient voices understanding of current disease status and treatment options and is in agreement with the current care plan.  All questions were answered. The patient knows to call the clinic with any problems, questions or concerns. We can certainly see the patient much sooner if necessary.  I spent 10 minutes counseling the patient face to face. The total time spent in the appointment was 15 minutes.  Disclaimer: This note was dictated with voice recognition software. Similar sounding words can inadvertently be transcribed and may not be corrected upon review.

## 2018-05-02 NOTE — Assessment & Plan Note (Addendum)
This is a very pleasant 42 year old African-American female with essential thrombocythemia and currently on treatment with hydroxyurea 500 mg p.o. daily.  She has been tolerating this treatment well but she had one episode of elevated liver enzyme in the past which significantly improved and the patient is back to her normal range.  CBC today showed normal platelets at 330,000.  Liver enzymes are normal.  Total bilirubin is slightly elevated 1.4 consistent with her baseline. I recommended for the patient to continue her current treatment with hydroxyurea. She will come back for follow-up visit in 1 month for reevaluation and repeat blood work.  She was advised to call immediately if she has any concerning symptoms in the interval. The patient voices understanding of current disease status and treatment options and is in agreement with the current care plan.  All questions were answered. The patient knows to call the clinic with any problems, questions or concerns. We can certainly see the patient much sooner if necessary.

## 2018-05-02 NOTE — Progress Notes (Signed)
[place] OFFICE PROGRESS NOTE  Darlene Nutting, DO [place] [address]  [place] thrombocythemia diagnosed in May 2019. Positive for JAK2 mutation.  PRIOR THERAPY:None  CURRENT THERAPY:Hydroxyurea 500 mg daily. The first dose was started on 01/01/2018.Status post52monthsof therapy.Her treatment was placed on hold on 03/05/2018 secondary to elevated liver enzymes. She will resume treatmentwith hydroxyurea 500 mg daily on 03/14/2018.  INTERVAL HISTORY: Darlene Davis 42 y.o. female returns for a routine follow-up visit by herself.  The patient is feeling fine today and has no specific complaints.  She denies fevers and chills.  Denies chest pain, shortness of breath, cough, hemoptysis.  Denies nausea, vomiting, constipation, diarrhea.  Denies recent weight loss or night sweats.  The patient is here for evaluation and repeat lab work.  MEDICAL HISTORY: Past Medical History:  Diagnosis Date  . 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  MEDICATIONS:  Current Outpatient Medications  Medication Sig Dispense Refill  . acyclovir (ZOVIRAX) 400 MG tablet     . Ascorbic Acid (VITAMIN C PO) Take by mouth.    . Cholecalciferol (VITAMIN D PO) Take by mouth.    . hydroxyurea (HYDREA) 500 MG capsule Take 1 capsule (500 mg total) by mouth daily. May take with food to minimize GI side effects. 30 capsule 2  . Multiple Vitamin (MULTIVITAMIN) tablet Take 1 tablet by mouth daily.    . Multiple Vitamins-Iron (MULTIVITAMINS WITH IRON) TABS tablet Take 1 tablet by mouth daily.    . norethindrone-ethinyl estradiol 1/35 (NORTREL 1/35, 21,) tablet Take 1 tablet by mouth daily. 1 Package 11  . prochlorperazine (COMPAZINE) 10 MG tablet Take 1 tablet (10 mg total) by mouth every 6 (six) hours as needed for nausea or vomiting. 30 tablet 0  . SPRINTEC 28 0.25-35 MG-MCG tablet Take 1 tablet by mouth  daily.     No current facility-administered medications for this visit.     SURGICAL HISTORY:  Past Surgical History:  Procedure Laterality Date  . NO PAST SURGERIES      REVIEW OF SYSTEMS:   Review of Systems  Constitutional: Negative for appetite change, chills, fatigue, fever and unexpected weight change.  HENT:   Negative for mouth sores, nosebleeds, sore throat and trouble swallowing.   Eyes: Negative for eye problems and icterus.  Respiratory: Negative for cough, hemoptysis, shortness of breath and wheezing.   Cardiovascular: Negative for chest pain and leg swelling.  Gastrointestinal: Negative for abdominal pain, constipation, diarrhea, nausea and vomiting.  Genitourinary: Negative for bladder incontinence, difficulty urinating, dysuria, frequency and hematuria.   Musculoskeletal: Negative for back pain, gait problem, neck pain and neck stiffness.  Skin: Negative for itching and rash.  Neurological: Negative for dizziness, extremity weakness, gait problem, headaches, light-headedness and seizures.  Hematological: Negative for adenopathy. Does not bruise/bleed easily.  Psychiatric/Behavioral: Negative for confusion, depression and sleep disturbance. The patient is not nervous/anxious.     PHYSICAL EXAMINATION:  Blood pressure (!) 145/72, pulse 83, temperature 98.8 F (37.1 C), temperature source Oral, resp. rate 18, height 5\' 2"  (1.575 m), weight 132 lb 11.2 oz (60.2 kg), SpO2 100 %.  ECOG PERFORMANCE STATUS: 0 - Asymptomatic  Physical Exam  Constitutional: Oriented to person, place, and time and well-developed, well-nourished, and in no distress. No distress.  HENT:  Head: Normocephalic and atraumatic.  Mouth/Throat: Oropharynx is clear and moist. No oropharyngeal exudate.  Eyes: Conjunctivae are normal. Right  eye exhibits no discharge. Left eye exhibits no discharge. No scleral icterus.  Neck: Normal range of motion. Neck supple.  Cardiovascular: Normal rate, regular  rhythm, normal heart sounds and intact distal pulses.   Pulmonary/Chest: Effort normal and breath sounds normal. No respiratory distress. No wheezes. No rales.  Abdominal: Soft. Bowel sounds are normal. Exhibits no distension and no mass. There is no tenderness.  Musculoskeletal: Normal range of motion. Exhibits no edema.  Lymphadenopathy:    No cervical adenopathy.  Neurological: Alert and oriented to person, place, and time. Exhibits normal muscle tone. Gait normal. Coordination normal.  Skin: Skin is warm and dry. No rash noted. Not diaphoretic. No erythema. No pallor.  Psychiatric: Mood, memory and judgment normal.  Vitals reviewed.  LABORATORY DATA: Lab Results  Component Value Date   WBC 5.9 05/05/2018   HGB 13.1 05/05/2018   HCT 39.9 05/05/2018   MCV 97.0 05/05/2018   PLT 330 05/05/2018      Chemistry      Component Value Date/Time   NA 142 05/05/2018 0813   K 3.8 05/05/2018 0813   CL 106 05/05/2018 0813   CO2 27 05/05/2018 0813   BUN 5 (L) 05/05/2018 0813   CREATININE 0.92 05/05/2018 0813      Component Value Date/Time   CALCIUM 9.1 05/05/2018 0813   ALKPHOS 50 05/05/2018 0813   AST 20 05/05/2018 0813   ALT 20 05/05/2018 0813   BILITOT 1.4 (H) 05/05/2018 0813       RADIOGRAPHIC STUDIES:  No results found.   ASSESSMENT/PLAN:  Essential thrombocythemia [facility]) This is a very pleasant 42 year old African-American female with essential thrombocythemia and currently on treatment with hydroxyurea 500 mg p.o. daily.  She has been tolerating this treatment well but she had one episode of elevated liver enzyme in the past which significantly improved and the patient is back to her normal range.  CBC today showed normal platelets at 330,000.  Liver enzymes are normal.  Total bilirubin is slightly elevated 1.4 consistent with her baseline. I recommended for the patient to continue her current treatment with hydroxyurea. She will come back for follow-up visit in 1 month  for reevaluation and repeat blood work.  She was advised to call immediately if she has any concerning symptoms in the interval. The patient voices understanding of current disease status and treatment options and is in agreement with the current care plan.  All questions were answered. The patient knows to call the clinic with any problems, questions or concerns. We can certainly see the patient much sooner if necessary.   Orders Placed This Encounter  Procedures  . CBC with Differential (Cancer Center Only)    Standing Status:   Future    Standing Expiration Date:   05/06/2019  . CMP ([facility] [place] only)    Standing Status:   Future    Standing Expiration Date:   05/06/2019  . Lactate dehydrogenase    Standing Status:   Future    Standing Expiration Date:   05/06/2019     Mikey Bussing, DNP, AGPCNP-BC, AOCNP 05/05/18

## 2018-05-05 ENCOUNTER — Other Ambulatory Visit: Payer: Self-pay

## 2018-05-05 ENCOUNTER — Encounter: Payer: Self-pay | Admitting: Oncology

## 2018-05-05 ENCOUNTER — Telehealth: Payer: Self-pay | Admitting: Internal Medicine

## 2018-05-05 ENCOUNTER — Inpatient Hospital Stay: Payer: 59 | Attending: Internal Medicine

## 2018-05-05 ENCOUNTER — Inpatient Hospital Stay (HOSPITAL_BASED_OUTPATIENT_CLINIC_OR_DEPARTMENT_OTHER): Payer: 59 | Admitting: Oncology

## 2018-05-05 VITALS — BP 145/72 | HR 83 | Temp 98.8°F | Resp 18 | Ht 62.0 in | Wt 132.7 lb

## 2018-05-05 DIAGNOSIS — D473 Essential (hemorrhagic) thrombocythemia: Secondary | ICD-10-CM | POA: Diagnosis not present

## 2018-05-05 LAB — CMP (CANCER CENTER ONLY)
ALK PHOS: 50 U/L (ref 38–126)
ALT: 20 U/L (ref 0–44)
AST: 20 U/L (ref 15–41)
Albumin: 3.8 g/dL (ref 3.5–5.0)
Anion gap: 9 (ref 5–15)
BILIRUBIN TOTAL: 1.4 mg/dL — AB (ref 0.3–1.2)
BUN: 5 mg/dL — ABNORMAL LOW (ref 6–20)
CALCIUM: 9.1 mg/dL (ref 8.9–10.3)
CO2: 27 mmol/L (ref 22–32)
CREATININE: 0.92 mg/dL (ref 0.44–1.00)
Chloride: 106 mmol/L (ref 98–111)
GFR, Estimated: >60 mL/min (ref >60)
GLUCOSE: 64 mg/dL — AB (ref 70–99)
Potassium: 3.8 mmol/L (ref 3.5–5.1)
SODIUM: 142 mmol/L (ref 135–145)
Total Protein: 7.4 g/dL (ref 6.5–8.1)

## 2018-05-05 LAB — CBC WITH DIFFERENTIAL (CANCER CENTER ONLY)
BASOS ABS: 0.1 10*3/uL (ref 0.0–0.1)
Basophils Relative: 1 %
EOS PCT: 1 %
Eosinophils Absolute: 0.1 10*3/uL (ref 0.0–0.5)
HEMATOCRIT: 39.9 % (ref 34.8–46.6)
Hemoglobin: 13.1 g/dL (ref 11.6–15.9)
LYMPHS ABS: 2.4 10*3/uL (ref 0.9–3.3)
LYMPHS PCT: 41 %
MCH: 32 pg (ref 25.1–34.0)
MCHC: 33 g/dL (ref 31.5–36.0)
MCV: 97 fL (ref 79.5–101.0)
MONO ABS: 0.6 10*3/uL (ref 0.1–0.9)
Monocytes Relative: 10 %
NEUTROS ABS: 2.7 10*3/uL (ref 1.5–6.5)
Neutrophils Relative %: 47 %
Platelet Count: 330 10*3/uL (ref 145–400)
RBC: 4.11 MIL/uL (ref 3.70–5.45)
RDW: 14.6 % — ABNORMAL HIGH (ref 11.2–14.5)
WBC Count: 5.9 10*3/uL (ref 3.9–10.3)

## 2018-05-05 LAB — LACTATE DEHYDROGENASE: LDH: 147 U/L (ref 98–192)

## 2018-05-05 NOTE — Telephone Encounter (Signed)
Appts scheduled avs/calendar printed per 9/30 los °

## 2018-06-09 ENCOUNTER — Telehealth: Payer: Self-pay | Admitting: Internal Medicine

## 2018-06-09 ENCOUNTER — Inpatient Hospital Stay: Payer: 59 | Admitting: Oncology

## 2018-06-09 ENCOUNTER — Inpatient Hospital Stay: Payer: 59 | Attending: Internal Medicine

## 2018-06-09 ENCOUNTER — Encounter: Payer: Self-pay | Admitting: Oncology

## 2018-06-09 VITALS — BP 140/93 | HR 80 | Temp 98.1°F | Resp 18 | Ht 62.0 in | Wt 131.3 lb

## 2018-06-09 DIAGNOSIS — R748 Abnormal levels of other serum enzymes: Secondary | ICD-10-CM | POA: Diagnosis not present

## 2018-06-09 DIAGNOSIS — D473 Essential (hemorrhagic) thrombocythemia: Secondary | ICD-10-CM | POA: Diagnosis not present

## 2018-06-09 DIAGNOSIS — Z79899 Other long term (current) drug therapy: Secondary | ICD-10-CM | POA: Diagnosis not present

## 2018-06-09 LAB — CBC WITH DIFFERENTIAL (CANCER CENTER ONLY)
ABS IMMATURE GRANULOCYTES: 0.01 10*3/uL (ref 0.00–0.07)
BASOS PCT: 1 %
Basophils Absolute: 0.1 10*3/uL (ref 0.0–0.1)
EOS ABS: 0.1 10*3/uL (ref 0.0–0.5)
Eosinophils Relative: 1 %
HCT: 42 % (ref 36.0–46.0)
Hemoglobin: 14 g/dL (ref 12.0–15.0)
IMMATURE GRANULOCYTES: 0 %
Lymphocytes Relative: 39 %
Lymphs Abs: 2.7 10*3/uL (ref 0.7–4.0)
MCH: 32 pg (ref 26.0–34.0)
MCHC: 33.3 g/dL (ref 30.0–36.0)
MCV: 96.1 fL (ref 80.0–100.0)
Monocytes Absolute: 0.7 10*3/uL (ref 0.1–1.0)
Monocytes Relative: 10 %
NEUTROS ABS: 3.4 10*3/uL (ref 1.7–7.7)
NEUTROS PCT: 49 %
NRBC: 0 % (ref 0.0–0.2)
PLATELETS: 344 10*3/uL (ref 150–400)
RBC: 4.37 MIL/uL (ref 3.87–5.11)
RDW: 13.1 % (ref 11.5–15.5)
WBC: 7 10*3/uL (ref 4.0–10.5)

## 2018-06-09 LAB — CMP (CANCER CENTER ONLY)
ALBUMIN: 4.1 g/dL (ref 3.5–5.0)
ALT: 17 U/L (ref 0–44)
ANION GAP: 10 (ref 5–15)
AST: 23 U/L (ref 15–41)
Alkaline Phosphatase: 52 U/L (ref 38–126)
BILIRUBIN TOTAL: 1.5 mg/dL — AB (ref 0.3–1.2)
BUN: 8 mg/dL (ref 6–20)
CO2: 29 mmol/L (ref 22–32)
Calcium: 10 mg/dL (ref 8.9–10.3)
Chloride: 104 mmol/L (ref 98–111)
Creatinine: 1.04 mg/dL — ABNORMAL HIGH (ref 0.44–1.00)
Glucose, Bld: 80 mg/dL (ref 70–99)
POTASSIUM: 4.8 mmol/L (ref 3.5–5.1)
Sodium: 143 mmol/L (ref 135–145)
TOTAL PROTEIN: 7.7 g/dL (ref 6.5–8.1)

## 2018-06-09 LAB — LACTATE DEHYDROGENASE: LDH: 169 U/L (ref 98–192)

## 2018-06-09 MED ORDER — HYDROXYUREA 500 MG PO CAPS: 500.0000 mg | ORAL_CAPSULE | Freq: Every day | ORAL | 1 refills | Status: DC

## 2018-06-09 NOTE — Telephone Encounter (Signed)
Appts scheudled avs/calendar printed per 11/4 los

## 2018-06-09 NOTE — Progress Notes (Signed)
[place] OFFICE PROGRESS NOTE  [name], DO [place] [address]  [place] thrombocythemia diagnosed in May 2019. Positive for JAK2 mutation.  PRIOR THERAPY:None  CURRENT THERAPY:Hydroxyurea 500 mg daily. The first dose was started on 01/01/2018.Status post54monthsof therapy.Her treatment was placed on hold on 03/05/2018 secondary to elevated liver enzymes. She resumed treatmentwith hydroxyurea 500 mg daily on 03/14/2018.  INTERVAL HISTORY: Darlene Davis 42 y.o. female returns for routine follow-up visit by herself.  The patient is feeling fine today and has no specific complaints except for easy bruisability.  She denies fevers and chills.  Denies chest pain, shortness of breath, cough, hemoptysis.  Denies nausea, vomiting, constipation, diarrhea.  Denies recent weight loss or night sweats.  Denies bleeding.  Notes that she bruises easily when she brushes up against something.  The patient is here for evaluation and repeat lab work.  MEDICAL HISTORY: Past Medical History:  Diagnosis Date  . 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  MEDICATIONS:  Current Outpatient Medications  Medication Sig Dispense Refill  . acyclovir (ZOVIRAX) 400 MG tablet     . Ascorbic Acid (VITAMIN C PO) Take by mouth.    . Cholecalciferol (VITAMIN D PO) Take by mouth.    . hydroxyurea (HYDREA) 500 MG capsule Take 1 capsule (500 mg total) by mouth daily. May take with food to minimize GI side effects. 90 capsule 1  . Multiple Vitamin (MULTIVITAMIN) tablet Take 1 tablet by mouth daily.    . Multiple Vitamins-Iron (MULTIVITAMINS WITH IRON) TABS tablet Take 1 tablet by mouth daily.    . norethindrone-ethinyl estradiol 1/35 (NORTREL 1/35, 21,) tablet Take 1 tablet by mouth daily. 1 Package 11  . prochlorperazine (COMPAZINE) 10 MG tablet Take 1 tablet (10 mg total) by mouth every 6 (six)  hours as needed for nausea or vomiting. 30 tablet 0  . SPRINTEC 28 0.25-35 MG-MCG tablet Take 1 tablet by mouth daily.     No current facility-administered medications for this visit.     SURGICAL HISTORY:  Past Surgical History:  Procedure Laterality Date  . NO PAST SURGERIES      REVIEW OF SYSTEMS:   Review of Systems  Constitutional: Negative for appetite change, chills, fatigue, fever and unexpected weight change.  HENT:   Negative for mouth sores, nosebleeds, sore throat and trouble swallowing.   Eyes: Negative for eye problems and icterus.  Respiratory: Negative for cough, hemoptysis, shortness of breath and wheezing.   Cardiovascular: Negative for chest pain and leg swelling.  Gastrointestinal: Negative for abdominal pain, constipation, diarrhea, nausea and vomiting.  Genitourinary: Negative for bladder incontinence, difficulty urinating, dysuria, frequency and hematuria.   Musculoskeletal: Negative for back pain, gait problem, neck pain and neck stiffness.  Skin: Negative for itching and rash.  Neurological: Negative for dizziness, extremity weakness, gait problem, headaches, light-headedness and seizures.  Hematological: Negative for adenopathy.  Reports that she bruises more easily.  Denies bleeding. Psychiatric/Behavioral: Negative for confusion, depression and sleep disturbance. The patient is not nervous/anxious.     PHYSICAL EXAMINATION:  Blood pressure (!) 140/93, pulse 80, temperature 98.1 F (36.7 C), temperature source Oral, resp. rate 18, height 5\' 2"  (1.575 m), weight 131 lb 4.8 oz (59.6 kg), SpO2 100 %.  ECOG PERFORMANCE STATUS: 1 - Symptomatic but completely ambulatory  Physical Exam  Constitutional: Oriented to person, place, and time and well-developed, well-nourished, and in no distress.  No distress.  HENT:  Head: Normocephalic and atraumatic.  Mouth/Throat: Oropharynx is clear and moist. No oropharyngeal exudate.  Eyes: Conjunctivae are normal. Right  eye exhibits no discharge. Left eye exhibits no discharge. No scleral icterus.  Neck: Normal range of motion. Neck supple.  Cardiovascular: Normal rate, regular rhythm, normal heart sounds and intact distal pulses.   Pulmonary/Chest: Effort normal and breath sounds normal. No respiratory distress. No wheezes. No rales.  Abdominal: Soft. Bowel sounds are normal. Exhibits no distension and no mass. There is no tenderness.  Musculoskeletal: Normal range of motion. Exhibits no edema.  Lymphadenopathy:    No cervical adenopathy.  Neurological: Alert and oriented to person, place, and time. Exhibits normal muscle tone. Gait normal. Coordination normal.  Skin: Skin is warm and dry. No rash noted. Not diaphoretic. No erythema. No pallor.  Psychiatric: Mood, memory and judgment normal.  Vitals reviewed.  LABORATORY DATA: Lab Results  Component Value Date   WBC 7.0 06/09/2018   HGB 14.0 06/09/2018   HCT 42.0 06/09/2018   MCV 96.1 06/09/2018   PLT 344 06/09/2018      Chemistry      Component Value Date/Time   NA 143 06/09/2018 0814   K 4.8 06/09/2018 0814   CL 104 06/09/2018 0814   CO2 29 06/09/2018 0814   BUN 8 06/09/2018 0814   CREATININE 1.04 (H) 06/09/2018 0814      Component Value Date/Time   CALCIUM 10.0 06/09/2018 0814   ALKPHOS 52 06/09/2018 0814   AST 23 06/09/2018 0814   ALT 17 06/09/2018 0814   BILITOT 1.5 (H) 06/09/2018 0814       RADIOGRAPHIC STUDIES:  No results found.   ASSESSMENT/PLAN:  Essential thrombocythemia [facility]) This is a very pleasant 42 year old African-American female with essential thrombocythemia and currently on treatment with hydroxyurea 500 mg p.o. daily. She has been tolerating this treatment well but she had one episode of elevated liver enzyme in the past which significantly improved and the patient is back to her normal range.  Labs from today have been reviewed.  Her CBC is completely normal.  Her liver enzymes are normal.  Her total  bilirubin is slightly elevated at 1.5 which is consistent with her baseline.  Recommend for her to continue on hydroxyurea 500 mg daily.  She will follow-up in 3 months for evaluation and repeat lab work.  She was advised to call immediately if she has any concerning symptoms in the interval. The patient voices understanding of current disease status and treatment options and is in agreement with the current care plan.  All questions were answered. The patient knows to call the clinic with any problems, questions or concerns. We can certainly see the patient much sooner if necessary.    Orders Placed This Encounter  Procedures  . CBC with Differential (Cancer Center Only)    Standing Status:   Future    Standing Expiration Date:   06/10/2019  . CMP ([place] only)    Standing Status:   Future    Standing Expiration Date:   06/10/2019  . Lactate dehydrogenase    Standing Status:   Future    Standing Expiration Date:   06/10/2019     Mikey Bussing, DNP, AGPCNP-BC, AOCNP 06/09/18

## 2018-06-09 NOTE — Assessment & Plan Note (Signed)
This is a very pleasant 42 year old African-American female with essential thrombocythemia and currently on treatment with hydroxyurea 500 mg p.o. daily. She has been tolerating this treatment well but she had one episode of elevated liver enzyme in the past which significantly improved and the patient is back to her normal range.  Labs from today have been reviewed.  Her CBC is completely normal.  Her liver enzymes are normal.  Her total bilirubin is slightly elevated at 1.5 which is consistent with her baseline.  Recommend for her to continue on hydroxyurea 500 mg daily.  She will follow-up in 3 months for evaluation and repeat lab work.  She was advised to call immediately if she has any concerning symptoms in the interval. The patient voices understanding of current disease status and treatment options and is in agreement with the current care plan.  All questions were answered. The patient knows to call the clinic with any problems, questions or concerns. We can certainly see the patient much sooner if necessary.

## 2018-06-24 ENCOUNTER — Telehealth: Payer: Self-pay

## 2018-06-24 DIAGNOSIS — D473 Essential (hemorrhagic) thrombocythemia: Secondary | ICD-10-CM

## 2018-06-24 NOTE — Addendum Note (Signed)
Addended by: Milford Cage on: 06/24/2018 11:45 AM   Modules accepted: Orders

## 2018-06-24 NOTE — Telephone Encounter (Signed)
Please advise if okay to place referral.

## 2018-06-24 NOTE — Telephone Encounter (Signed)
Copied from [place] [id number]. Topic: Referral - Request for Referral >> Jun 24, 2018 10:50 AM Virl Axe D wrote: Has patient seen PCP for this complaint? Yes *If NO, is insurance requiring patient see PCP for this issue before PCP can refer them? Referral for which specialty: Hematology / D47.3 Essential thrombocytosis  Preferred provider/office: Dr. Curt Bears / [facility] Reason for referral: Pt has been seeing this provider routinely since August 2019 however she needs a referral in order to have the visits covered by insurance, previous and future. If possible, she needs to have a referral dated back to 03/06/2018 faxed to [facility] Fax# [phone] Attn: Ellison Hughs Reference # [id number]. Please advise. Pt CB [phone]

## 2018-06-24 NOTE — Telephone Encounter (Signed)
Ok to place referral for ongoing appts.

## 2018-07-09 ENCOUNTER — Telehealth: Payer: Self-pay

## 2018-07-09 NOTE — Telephone Encounter (Signed)
Copied from [place] [phone] [id number]. Topic: Referral - Request for Referral >> Jul 09, 2018 11:47 AM Scherrie Gerlach wrote: Gwenlyn Perking w/ [facility] calling on behalf of the pt, because the referral submitted to oncology in April 2019 was good for 6 mo or 6 visits, and pt exceeded the 6 visits. But [employer] states if you will send a backdated referral with the date 03/14/18 they will cover.  But the referral needs to be faxed, cannot do on line Fax : [phone] Ref no.  [id number]  Gwenlyn Perking will remind the pt that next time she needs to call her dr for referral if she is to exceed 6 visits or the 6 mo

## 2018-07-09 NOTE — Telephone Encounter (Signed)
Referral has been faxed to [facility] to the fax number listed in request.

## 2018-07-18 NOTE — Telephone Encounter (Signed)
Pt called in and stated [employer] did not rec this faxed referral.  She would like to know if this can be resent?  All info in previous message is confirmed correct

## 2018-07-22 NOTE — Telephone Encounter (Signed)
Referral faxed 2nd time to [facility] - Fax : [phone] Ref no.  [id number]. Confirmation received

## 2018-09-10 ENCOUNTER — Inpatient Hospital Stay (HOSPITAL_BASED_OUTPATIENT_CLINIC_OR_DEPARTMENT_OTHER): Payer: 59 | Admitting: Internal Medicine

## 2018-09-10 ENCOUNTER — Inpatient Hospital Stay: Payer: 59 | Attending: Internal Medicine

## 2018-09-10 ENCOUNTER — Telehealth: Payer: Self-pay | Admitting: Internal Medicine

## 2018-09-10 ENCOUNTER — Encounter: Payer: Self-pay | Admitting: Internal Medicine

## 2018-09-10 VITALS — BP 132/79 | HR 74 | Temp 98.2°F | Resp 18 | Ht 62.0 in | Wt 132.5 lb

## 2018-09-10 DIAGNOSIS — D473 Essential (hemorrhagic) thrombocythemia: Secondary | ICD-10-CM | POA: Diagnosis present

## 2018-09-10 LAB — CMP (CANCER CENTER ONLY)
ALBUMIN: 3.7 g/dL (ref 3.5–5.0)
ALK PHOS: 55 U/L (ref 38–126)
ALT: 19 U/L (ref 0–44)
AST: 19 U/L (ref 15–41)
Anion gap: 8 (ref 5–15)
BUN: 7 mg/dL (ref 6–20)
CALCIUM: 9.1 mg/dL (ref 8.9–10.3)
CO2: 28 mmol/L (ref 22–32)
Chloride: 103 mmol/L (ref 98–111)
Creatinine: 0.99 mg/dL (ref 0.44–1.00)
GFR, Est AFR Am: >60 mL/min (ref >60)
GFR, Estimated: >60 mL/min (ref >60)
Glucose, Bld: 97 mg/dL (ref 70–99)
Potassium: 3.9 mmol/L (ref 3.5–5.1)
Sodium: 139 mmol/L (ref 135–145)
Total Bilirubin: 1.1 mg/dL (ref 0.3–1.2)
Total Protein: 7.2 g/dL (ref 6.5–8.1)

## 2018-09-10 LAB — CBC WITH DIFFERENTIAL (CANCER CENTER ONLY)
Abs Immature Granulocytes: 0.02 10*3/uL (ref 0.00–0.07)
BASOS ABS: 0 10*3/uL (ref 0.0–0.1)
Basophils Relative: 0 %
Eosinophils Absolute: 0.1 10*3/uL (ref 0.0–0.5)
Eosinophils Relative: 1 %
HEMATOCRIT: 40.1 % (ref 36.0–46.0)
HEMOGLOBIN: 13.2 g/dL (ref 12.0–15.0)
IMMATURE GRANULOCYTES: 0 %
LYMPHS ABS: 2 10*3/uL (ref 0.7–4.0)
LYMPHS PCT: 22 %
MCH: 32.7 pg (ref 26.0–34.0)
MCHC: 32.9 g/dL (ref 30.0–36.0)
MCV: 99.3 fL (ref 80.0–100.0)
MONOS PCT: 9 %
Monocytes Absolute: 0.8 10*3/uL (ref 0.1–1.0)
NEUTROS PCT: 68 %
NRBC: 0 % (ref 0.0–0.2)
Neutro Abs: 6.1 10*3/uL (ref 1.7–7.7)
Platelet Count: 314 10*3/uL (ref 150–400)
RBC: 4.04 MIL/uL (ref 3.87–5.11)
RDW: 12.4 % (ref 11.5–15.5)
WBC Count: 9.1 10*3/uL (ref 4.0–10.5)

## 2018-09-10 LAB — LACTATE DEHYDROGENASE: LDH: 148 U/L (ref 98–192)

## 2018-09-10 NOTE — Telephone Encounter (Signed)
Scheduled appt per 02/05 los.  Patient declined calendar and avs.

## 2018-09-10 NOTE — Progress Notes (Signed)
[place] [phone]   [phone]  OFFICE PROGRESS NOTE  [name], DO [place] [address]  DIAGNOSIS:Essential thrombocythemia diagnosed in May 2019. Positive for JAK2 mutation.  PRIOR THERAPY:None  CURRENT THERAPY:Hydroxyurea 500 mg daily. The first dose was started on 01/01/2018.Status post65monthsof therapy.  Her treatment was placed on hold on 03/05/2018 secondary to elevated liver enzymes.  She will resume treatment with hydroxyurea 500 mg daily on 03/14/2018.  INTERVAL HISTORY: Darlene Davis 43 y.o. female returns to the clinic today for follow-up visit.  The patient is feeling fine today with no concerning complaints.  She continues to tolerate her treatment with hydroxyurea fairly well.  She denied having any nausea, vomiting, diarrhea or constipation.  She denied having any chest pain, shortness of breath, cough or hemoptysis.  She has no fever or chills.  The patient is here today for evaluation and repeat blood work.  MEDICAL HISTORY: Past Medical History:  Diagnosis Date  . 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  MEDICATIONS:  Current Outpatient Medications  Medication Sig Dispense Refill  . acyclovir (ZOVIRAX) 400 MG tablet     . Ascorbic Acid (VITAMIN C PO) Take by mouth.    . Cholecalciferol (VITAMIN D PO) Take by mouth.    . hydroxyurea (HYDREA) 500 MG capsule Take 1 capsule (500 mg total) by mouth daily. May take with food to minimize GI side effects. 90 capsule 1  . Multiple Vitamin (MULTIVITAMIN) tablet Take 1 tablet by mouth daily.    . Multiple Vitamins-Iron (MULTIVITAMINS WITH IRON) TABS tablet Take 1 tablet by mouth daily.    . norethindrone-ethinyl estradiol 1/35 (NORTREL 1/35, 21,) tablet Take 1 tablet by mouth daily. 1 Package 11  . prochlorperazine (COMPAZINE) 10 MG tablet Take 1 tablet (10 mg total) by mouth every 6  (six) hours as needed for nausea or vomiting. 30 tablet 0  . SPRINTEC 28 0.25-35 MG-MCG tablet Take 1 tablet by mouth daily.     No current facility-administered medications for this visit.     SURGICAL HISTORY:  Past Surgical History:  Procedure Laterality Date  . NO PAST SURGERIES      REVIEW OF SYSTEMS:  A comprehensive review of systems was negative.   PHYSICAL EXAMINATION: General appearance: alert, cooperative and no distress Head: Normocephalic, without obvious abnormality, atraumatic Neck: no adenopathy, no JVD, supple, symmetrical, trachea midline and thyroid not enlarged, symmetric, no tenderness/mass/nodules Lymph nodes: Cervical, supraclavicular, and axillary nodes normal. Resp: clear to auscultation bilaterally Back: symmetric, no curvature. ROM normal. No CVA tenderness. Cardio: regular rate and rhythm, S1, S2 normal, no murmur, click, rub or gallop GI: soft, non-tender; bowel sounds normal; no masses,  no organomegaly Extremities: extremities normal, atraumatic, no cyanosis or edema  ECOG PERFORMANCE STATUS: 0 - Asymptomatic  Blood pressure 132/79, pulse 74, temperature 98.2 F (36.8 C), temperature source Oral, resp. rate 18, height 5\' 2"  (1.575 m), weight 132 lb 8 oz (60.1 kg), SpO2 100 %.  LABORATORY DATA: Lab Results  Component Value Date   WBC 9.1 09/10/2018   HGB 13.2 09/10/2018   HCT 40.1 09/10/2018   MCV 99.3 09/10/2018   PLT 314 09/10/2018      Chemistry      Component Value Date/Time   NA 143 06/09/2018 0814   K 4.8 06/09/2018 0814   CL 104 06/09/2018 0814   CO2  29 06/09/2018 0814   BUN 8 06/09/2018 0814   CREATININE 1.04 (H) 06/09/2018 0814      Component Value Date/Time   CALCIUM 10.0 06/09/2018 0814   ALKPHOS 52 06/09/2018 0814   AST 23 06/09/2018 0814   ALT 17 06/09/2018 0814   BILITOT 1.5 (H) 06/09/2018 0814       RADIOGRAPHIC STUDIES: No results found.  ASSESSMENT AND PLAN: This is a very pleasant 43 years old  African-American female with essential thrombocythemia and currently on treatment with hydroxyurea 500 mg p.o. daily.   The patient is tolerating her treatment with hydroxyurea fairly well with no concerning adverse effects. CBC today showed no concerning abnormalities.  Comprehensive metabolic panel and LDH are still pending. I discussed the lab results with the patient and recommended for her to continue with her current dose of hydroxyurea 500 mg p.o. daily. The patient will come back for follow-up visit in 3 months for evaluation and repeat blood work. She was advised to call immediately if she has any concerning symptoms in the interval. The patient voices understanding of current disease status and treatment options and is in agreement with the current care plan. All questions were answered. The patient knows to call the clinic with any problems, questions or concerns. We can certainly see the patient much sooner if necessary.  I spent 10 minutes counseling the patient face to face. The total time spent in the appointment was 15 minutes.  Disclaimer: This note was dictated with voice recognition software. Similar sounding words can inadvertently be transcribed and may not be corrected upon review.

## 2018-09-10 NOTE — Addendum Note (Signed)
Addended by: Ardeen Garland on: 09/10/2018 08:49 AM   Modules accepted: Orders

## 2018-09-25 ENCOUNTER — Telehealth: Payer: Self-pay | Admitting: Internal Medicine

## 2018-09-25 NOTE — Telephone Encounter (Signed)
Per sch msg, patient wanted to reshcedule 05/05 appt, spoke with patient, no availablitly for date wanted, patient decided to keep appt the same.

## 2018-10-28 ENCOUNTER — Telehealth: Payer: Self-pay | Admitting: Medical Oncology

## 2018-10-28 NOTE — Telephone Encounter (Signed)
Pt states [employer] will be calling re quantity of hydrea.

## 2018-11-24 ENCOUNTER — Encounter: Payer: 59 | Admitting: Family Medicine

## 2018-11-25 ENCOUNTER — Telehealth: Payer: Self-pay | Admitting: Medical Oncology

## 2018-11-25 NOTE — Telephone Encounter (Signed)
LVM to keep appt in May.

## 2018-12-09 ENCOUNTER — Other Ambulatory Visit: Payer: 59

## 2018-12-09 ENCOUNTER — Ambulatory Visit: Payer: 59 | Admitting: Internal Medicine

## 2019-01-14 ENCOUNTER — Other Ambulatory Visit: Payer: Self-pay

## 2019-01-14 ENCOUNTER — Inpatient Hospital Stay (HOSPITAL_BASED_OUTPATIENT_CLINIC_OR_DEPARTMENT_OTHER): Payer: 59 | Admitting: Internal Medicine

## 2019-01-14 ENCOUNTER — Inpatient Hospital Stay: Payer: 59 | Attending: Internal Medicine

## 2019-01-14 ENCOUNTER — Encounter: Payer: Self-pay | Admitting: Internal Medicine

## 2019-01-14 VITALS — BP 127/85 | HR 66 | Temp 97.1°F | Resp 18 | Ht 62.0 in | Wt 128.6 lb

## 2019-01-14 DIAGNOSIS — D473 Essential (hemorrhagic) thrombocythemia: Secondary | ICD-10-CM | POA: Diagnosis not present

## 2019-01-14 LAB — CBC WITH DIFFERENTIAL (CANCER CENTER ONLY)
Abs Immature Granulocytes: 0.01 10*3/uL (ref 0.00–0.07)
Basophils Absolute: 0 10*3/uL (ref 0.0–0.1)
Basophils Relative: 1 %
Eosinophils Absolute: 0.1 10*3/uL (ref 0.0–0.5)
Eosinophils Relative: 1 %
HCT: 39.9 % (ref 36.0–46.0)
Hemoglobin: 13.2 g/dL (ref 12.0–15.0)
Immature Granulocytes: 0 %
Lymphocytes Relative: 35 %
Lymphs Abs: 2.5 10*3/uL (ref 0.7–4.0)
MCH: 32.8 pg (ref 26.0–34.0)
MCHC: 33.1 g/dL (ref 30.0–36.0)
MCV: 99.3 fL (ref 80.0–100.0)
Monocytes Absolute: 0.7 10*3/uL (ref 0.1–1.0)
Monocytes Relative: 10 %
Neutro Abs: 3.9 10*3/uL (ref 1.7–7.7)
Neutrophils Relative %: 53 %
Platelet Count: 313 10*3/uL (ref 150–400)
RBC: 4.02 MIL/uL (ref 3.87–5.11)
RDW: 12.8 % (ref 11.5–15.5)
WBC Count: 7.3 10*3/uL (ref 4.0–10.5)
nRBC: 0 % (ref 0.0–0.2)

## 2019-01-14 LAB — CMP (CANCER CENTER ONLY)
ALT: 19 U/L (ref 0–44)
AST: 21 U/L (ref 15–41)
Albumin: 4.1 g/dL (ref 3.5–5.0)
Alkaline Phosphatase: 50 U/L (ref 38–126)
Anion gap: 10 (ref 5–15)
BUN: 7 mg/dL (ref 6–20)
CO2: 28 mmol/L (ref 22–32)
Calcium: 9.4 mg/dL (ref 8.9–10.3)
Chloride: 103 mmol/L (ref 98–111)
Creatinine: 1.09 mg/dL — ABNORMAL HIGH (ref 0.44–1.00)
GFR, Est AFR Am: >60 mL/min (ref >60)
GFR, Estimated: >60 mL/min (ref >60)
Glucose, Bld: 88 mg/dL (ref 70–99)
Potassium: 4.7 mmol/L (ref 3.5–5.1)
Sodium: 141 mmol/L (ref 135–145)
Total Bilirubin: 1.4 mg/dL — ABNORMAL HIGH (ref 0.3–1.2)
Total Protein: 7.8 g/dL (ref 6.5–8.1)

## 2019-01-14 LAB — LACTATE DEHYDROGENASE: LDH: 165 U/L (ref 98–192)

## 2019-01-14 LAB — HM MAMMOGRAPHY

## 2019-01-14 NOTE — Progress Notes (Signed)
[place] [phone]   Fax:(336) [phone]  OFFICE PROGRESS NOTE  [name], DO [place] [address]  DIAGNOSIS:Essential thrombocythemia diagnosed in May 2019. Positive for JAK2 mutation.  PRIOR THERAPY:None  CURRENT THERAPY:Hydroxyurea 500 mg daily. The first dose was started on 01/01/2018.Status post21monthsof therapy.  Her treatment was placed on hold on 03/05/2018 secondary to elevated liver enzymes.  She will resume treatment with hydroxyurea 500 mg daily on 03/14/2018.  INTERVAL HISTORY: Darlene Davis 43 y.o. female returns to the clinic today for follow-up visit.  The patient is feeling fine today with no concerning complaints.  She denied having any chest pain, shortness of breath, cough or hemoptysis.  She denied having any weight loss or night sweats.  She has no bleeding issues.  She denied having any headache or visual changes.  The patient is here today for evaluation and repeat blood work.  MEDICAL HISTORY: Past Medical History:  Diagnosis Date  . 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  MEDICATIONS:  Current Outpatient Medications  Medication Sig Dispense Refill  . acyclovir (ZOVIRAX) 400 MG tablet     . Ascorbic Acid (VITAMIN C PO) Take by mouth.    . Cholecalciferol (VITAMIN D PO) Take by mouth.    . hydroxyurea (HYDREA) 500 MG capsule Take 1 capsule (500 mg total) by mouth daily. May take with food to minimize GI side effects. 90 capsule 1  . Multiple Vitamins-Iron (MULTIVITAMINS WITH IRON) TABS tablet Take 1 tablet by mouth daily.    . norethindrone-ethinyl estradiol 1/35 (NORTREL 1/35, 21,) tablet Take 1 tablet by mouth daily. 1 Package 11  . prochlorperazine (COMPAZINE) 10 MG tablet Take 1 tablet (10 mg total) by mouth every 6 (six) hours as needed for nausea or vomiting. 30 tablet 0  . SPRINTEC 28 0.25-35 MG-MCG tablet Take 1 tablet  by mouth daily.     No current facility-administered medications for this visit.     SURGICAL HISTORY:  Past Surgical History:  Procedure Laterality Date  . NO PAST SURGERIES      REVIEW OF SYSTEMS:  A comprehensive review of systems was negative.   PHYSICAL EXAMINATION: General appearance: alert, cooperative and no distress Head: Normocephalic, without obvious abnormality, atraumatic Neck: no adenopathy, no JVD, supple, symmetrical, trachea midline and thyroid not enlarged, symmetric, no tenderness/mass/nodules Lymph nodes: Cervical, supraclavicular, and axillary nodes normal. Resp: clear to auscultation bilaterally Back: symmetric, no curvature. ROM normal. No CVA tenderness. Cardio: regular rate and rhythm, S1, S2 normal, no murmur, click, rub or gallop GI: soft, non-tender; bowel sounds normal; no masses,  no organomegaly Extremities: extremities normal, atraumatic, no cyanosis or edema  ECOG PERFORMANCE STATUS: 0 - Asymptomatic  Blood pressure 127/85, pulse 66, temperature (!) 97.1 F (36.2 C), temperature source Oral, resp. rate 18, height 5\' 2"  (1.575 m), weight 128 lb 9.6 oz (58.3 kg), SpO2 100 %.  LABORATORY DATA: Lab Results  Component Value Date   WBC 9.1 09/10/2018   HGB 13.2 09/10/2018   HCT 40.1 09/10/2018   MCV 99.3 09/10/2018   PLT 314 09/10/2018      Chemistry      Component Value Date/Time   NA 139 09/10/2018 0813   K 3.9 09/10/2018 0813   CL 103 09/10/2018 0813   CO2 28 09/10/2018 0813   BUN 7 09/10/2018 0813   CREATININE 0.99 09/10/2018 0813  Component Value Date/Time   CALCIUM 9.1 09/10/2018 0813   ALKPHOS 55 09/10/2018 0813   AST 19 09/10/2018 0813   ALT 19 09/10/2018 0813   BILITOT 1.1 09/10/2018 0813       RADIOGRAPHIC STUDIES: No results found.  ASSESSMENT AND PLAN: This is a very pleasant 43 years old African-American female with essential thrombocythemia and currently on treatment with hydroxyurea 500 mg p.o. daily.   She  has been tolerating her treatment well with no concerning adverse effects. Repeat CBC today is unremarkable for any abnormality.  Comprehensive metabolic panel and LDH are still pending. I recommended for the patient to continue on her current dose of hydroxyurea 500 mg p.o. daily. I will see her back for follow-up visit in 4 months for evaluation and repeat blood work. She was advised to call immediately if she has any concerning symptoms in the interval. The patient voices understanding of current disease status and treatment options and is in agreement with the current care plan. All questions were answered. The patient knows to call the clinic with any problems, questions or concerns. We can certainly see the patient much sooner if necessary.  I spent 10 minutes counseling the patient face to face. The total time spent in the appointment was 15 minutes.  Disclaimer: This note was dictated with voice recognition software. Similar sounding words can inadvertently be transcribed and may not be corrected upon review.

## 2019-01-15 ENCOUNTER — Telehealth: Payer: Self-pay | Admitting: Internal Medicine

## 2019-01-15 NOTE — Telephone Encounter (Signed)
Scheduled appt per 6/10 los - f/u in 4 months. Mailed reminder letter with appt date and time

## 2019-01-23 ENCOUNTER — Other Ambulatory Visit: Payer: Self-pay | Admitting: Internal Medicine

## 2019-01-23 DIAGNOSIS — D473 Essential (hemorrhagic) thrombocythemia: Secondary | ICD-10-CM

## 2019-01-27 ENCOUNTER — Telehealth: Payer: Self-pay

## 2019-01-27 NOTE — Telephone Encounter (Signed)
Questions for Screening COVID-19  Symptom onset: none  Travel or Contacts:None  During this illness, did/does the patient experience any of the following symptoms? Fever >100.74F []   Yes [x]   No []   Unknown Subjective fever (felt feverish) []   Yes [x]   No []   Unknown Chills []   Yes [x]   No []   Unknown Muscle aches (myalgia) []   Yes [x]   No []   Unknown Runny nose (rhinorrhea) []   Yes [x]   No []   Unknown Sore throat []   Yes [x]   No []   Unknown Cough (new onset or worsening of chronic cough) []   Yes [x]   No []   Unknown Shortness of breath (dyspnea) []   Yes [x]   No []   Unknown Nausea or vomiting []   Yes [x]   No []   Unknown Headache []   Yes [x]   No []   Unknown Abdominal pain  []   Yes [x]   No []   Unknown Diarrhea (?3 loose/looser than normal stools/24hr period) []   Yes [x]   No []   Unknown Other, specify:  Patient risk factors: Smoker? []   Current []   Former [x]   Never If female, currently pregnant? []   Yes [x]   No  Patient Active Problem List   Diagnosis Date Noted  . Essential thrombocythemia ([place]) 01/01/2018  . Well adult exam 11/21/2017  . Ganglion cyst of flexor tendon sheath 12/14/2010  . VENEREAL WART 12/29/2008    Plan:  []   High risk for COVID-19 with red flags go to ED (with CP, SOB, weak/lightheaded, or fever > 101.5). Call ahead.  []   High risk for COVID-19 but stable. Inform provider and coordinate time for [facility] visit.   [x]   No red flags but URI signs or symptoms okay for [facility] visit.

## 2019-01-28 ENCOUNTER — Ambulatory Visit (INDEPENDENT_AMBULATORY_CARE_PROVIDER_SITE_OTHER): Payer: 59 | Admitting: Family Medicine

## 2019-01-28 ENCOUNTER — Encounter: Payer: Self-pay | Admitting: Family Medicine

## 2019-01-28 VITALS — BP 116/82 | HR 86 | Temp 98.2°F | Resp 18 | Ht 62.25 in | Wt 126.8 lb

## 2019-01-28 DIAGNOSIS — Z0000 Encounter for general adult medical examination without abnormal findings: Secondary | ICD-10-CM | POA: Diagnosis not present

## 2019-01-28 DIAGNOSIS — D473 Essential (hemorrhagic) thrombocythemia: Secondary | ICD-10-CM

## 2019-01-28 DIAGNOSIS — Z13220 Encounter for screening for lipoid disorders: Secondary | ICD-10-CM | POA: Diagnosis not present

## 2019-01-28 NOTE — Assessment & Plan Note (Signed)
-  Well adult Orders Placed This Encounter  Procedures  . Lipid panel  . TSH  . Vitamin D (25 hydroxy)  Screenings: Lipid panel Immunizations: UTD Anticipatory guidance/Risk factor reduction:  Per AVS

## 2019-01-28 NOTE — Progress Notes (Signed)
Darlene Davis - 43 y.o. female MRN [id number]  Date of birth: July 25, 1976  Subjective Chief Complaint  Patient presents with  . Annual Exam    CPE     HPI Darlene Davis is a 43 y.o. female with history of essential thrombocythemia here today for annual exam.  She is followed by hematology for management of thrombocythemia and taking hydroxyurea. She is tolerating well and stable at this time.  She has upcoming visit with GYN in July for pap.  She has no new concerns today.  She would like to have updated labs.  She is a non smoker and exercises a few days per week.  Weight has been stable.   Review of Systems  Constitutional: Negative for chills, fever, malaise/fatigue and weight loss.  HENT: Negative for congestion, ear pain and sore throat.   Eyes: Negative for blurred vision, double vision and pain.  Respiratory: Negative for cough and shortness of breath.   Cardiovascular: Negative for chest pain and palpitations.  Gastrointestinal: Negative for abdominal pain, blood in stool, constipation, heartburn and nausea.  Genitourinary: Negative for dysuria and urgency.  Musculoskeletal: Negative for joint pain and myalgias.  Neurological: Negative for dizziness and headaches.  Endo/Heme/Allergies: Does not bruise/bleed easily.  Psychiatric/Behavioral: Negative for depression. The patient is not nervous/anxious and does not have insomnia.     No Known Allergies  Past Medical History:  Diagnosis Date  . Essential thrombocythemia ([place]) 01/01/2018  . 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 NO PAST SURGERIES      Social History   Socioeconomic History  . Marital status: Single    Spouse name: Not on file  . Number of children: 0  . Years of education: Not on file  . Highest education level: Not on file  Occupational History  . Not on file  Social Needs  . Financial resource strain: Not hard at all  . Food  insecurity    Worry: Never true    Inability: Never true  . Transportation needs    Medical: No    Non-medical: No  Tobacco Use  . Smoking status: Never Smoker  . Smokeless tobacco: Never Used  . Tobacco comment: Single-lives alone - [employer], work from home  Substance and Sexual Activity  . Alcohol use: Not Currently    Comment: rarely   . Drug use: Never  . Sexual activity: Not on file  Lifestyle  . Physical activity    Days per week: 4 days    Minutes per session: 30 min  . Stress: Only a little  Relationships  . Social Herbalist on phone: Three times a week    Gets together: Three times a week    Attends religious service: Not on file    Active member of club or organization: Not on file    Attends meetings of clubs or organizations: Not on file    Relationship status: Never married  Other Topics Concern  . Not on file  Social History Narrative   Works for [employer]    Family History  Problem Relation Age of Onset  . Hypertension Mother        hx of, off meds  . Hyperlipidemia Maternal Uncle   . Diabetes Maternal Grandmother   . Hypertension Brother   . Colon cancer Cousin 40       liver, hx EtOH  .  Colon cancer Paternal Uncle 37    Health Maintenance  Topic Date Due  . PAP SMEAR-Modifier  11/19/2014  . INFLUENZA VACCINE  03/07/2019  . TETANUS/TDAP  03/11/2019  . HIV Screening  Completed    ----------------------------------------------------------------------------------------------------------------------------------------------------------------------------------------------------------------- Physical Exam BP 116/82   Pulse 86   Temp 98.2 F (36.8 C) (Oral)   Resp 18   Ht 5' 2.25" (1.581 m)   Wt 126 lb 12.8 oz (57.5 kg)   SpO2 98%   BMI 23.01 kg/m   Physical Exam Constitutional:      General: She is not in acute distress. HENT:     Head: Normocephalic and atraumatic.     Nose: Nose normal.  Eyes:     General: No scleral  icterus.    Conjunctiva/sclera: Conjunctivae normal.  Neck:     Musculoskeletal: Normal range of motion and neck supple.     Thyroid: No thyromegaly.  Cardiovascular:     Rate and Rhythm: Normal rate and regular rhythm.     Heart sounds: Normal heart sounds.  Pulmonary:     Effort: Pulmonary effort is normal.     Breath sounds: Normal breath sounds.  Abdominal:     General: Bowel sounds are normal. There is no distension.     Palpations: Abdomen is soft.     Tenderness: There is no abdominal tenderness. There is no guarding.  Musculoskeletal: Normal range of motion.  Lymphadenopathy:     Cervical: No cervical adenopathy.  Skin:    General: Skin is warm and dry.     Findings: No rash.  Neurological:     Mental Status: She is alert and oriented to person, place, and time.     Cranial Nerves: No cranial nerve deficit.     Coordination: Coordination normal.  Psychiatric:        Behavior: Behavior normal.     ------------------------------------------------------------------------------------------------------------------------------------------------------------------------------------------------------------------- Assessment and Plan  Essential thrombocythemia ([place]) -Stable, she'll continue to follow with hematology.   Well adult exam -Well adult Orders Placed This Encounter  Procedures  . Lipid panel  . TSH  . Vitamin D (25 hydroxy)  Screenings: Lipid panel Immunizations: UTD Anticipatory guidance/Risk factor reduction:  Per AVS

## 2019-01-28 NOTE — Assessment & Plan Note (Signed)
-  Stable, she'll continue to follow with hematology.

## 2019-01-28 NOTE — Patient Instructions (Signed)

## 2019-01-29 LAB — VITAMIN D 25 HYDROXY (VIT D DEFICIENCY, FRACTURES): Vit D, 25-Hydroxy: 54 ng/mL (ref 30–100)

## 2019-01-29 LAB — TSH: TSH: 1.05 mIU/L

## 2019-01-29 LAB — LIPID PANEL
Cholesterol: 186 mg/dL (ref <200)
HDL: 84 mg/dL (ref >=50)
LDL Cholesterol (Calc): 85 mg/dL (calc)
Non-HDL Cholesterol (Calc): 102 mg/dL (calc) (ref <130)
Total CHOL/HDL Ratio: 2.2 (calc) (ref <5.0)
Triglycerides: 84 mg/dL (ref <150)

## 2019-02-04 ENCOUNTER — Telehealth: Payer: Self-pay | Admitting: Internal Medicine

## 2019-02-04 NOTE — Telephone Encounter (Signed)
Returned patient's phone call regarding rescheduling 10/12 lab and follow up appointment, the appointments have been moved to 10/13.

## 2019-02-10 ENCOUNTER — Other Ambulatory Visit: Payer: Self-pay | Admitting: Internal Medicine

## 2019-02-10 DIAGNOSIS — Z20822 Contact with and (suspected) exposure to covid-19: Secondary | ICD-10-CM

## 2019-02-15 LAB — NOVEL CORONAVIRUS, NAA: SARS-CoV-2, NAA: NOT DETECTED

## 2019-02-20 ENCOUNTER — Encounter: Payer: 59 | Admitting: Family Medicine

## 2019-02-20 LAB — HM PAP SMEAR

## 2019-05-18 ENCOUNTER — Other Ambulatory Visit: Payer: 59

## 2019-05-18 ENCOUNTER — Ambulatory Visit: Payer: 59 | Admitting: Internal Medicine

## 2019-05-19 ENCOUNTER — Encounter: Payer: Self-pay | Admitting: Internal Medicine

## 2019-05-19 ENCOUNTER — Inpatient Hospital Stay: Payer: 59

## 2019-05-19 ENCOUNTER — Other Ambulatory Visit: Payer: Self-pay

## 2019-05-19 ENCOUNTER — Inpatient Hospital Stay: Payer: 59 | Attending: Internal Medicine | Admitting: Internal Medicine

## 2019-05-19 VITALS — BP 124/78 | HR 80 | Temp 98.5°F | Resp 18 | Ht 62.25 in | Wt 133.0 lb

## 2019-05-19 DIAGNOSIS — D473 Essential (hemorrhagic) thrombocythemia: Secondary | ICD-10-CM | POA: Insufficient documentation

## 2019-05-19 LAB — CMP (CANCER CENTER ONLY)
ALT: 15 U/L (ref 0–44)
AST: 20 U/L (ref 15–41)
Albumin: 3.8 g/dL (ref 3.5–5.0)
Alkaline Phosphatase: 54 U/L (ref 38–126)
Anion gap: 10 (ref 5–15)
BUN: 11 mg/dL (ref 6–20)
CO2: 30 mmol/L (ref 22–32)
Calcium: 9.6 mg/dL (ref 8.9–10.3)
Chloride: 103 mmol/L (ref 98–111)
Creatinine: 0.98 mg/dL (ref 0.44–1.00)
GFR, Est AFR Am: >60 mL/min (ref >60)
GFR, Estimated: >60 mL/min (ref >60)
Glucose, Bld: 65 mg/dL — ABNORMAL LOW (ref 70–99)
Potassium: 4 mmol/L (ref 3.5–5.1)
Sodium: 143 mmol/L (ref 135–145)
Total Bilirubin: 1.4 mg/dL — ABNORMAL HIGH (ref 0.3–1.2)
Total Protein: 7.6 g/dL (ref 6.5–8.1)

## 2019-05-19 LAB — CBC WITH DIFFERENTIAL (CANCER CENTER ONLY)
Abs Immature Granulocytes: 0.01 10*3/uL (ref 0.00–0.07)
Basophils Absolute: 0.1 10*3/uL (ref 0.0–0.1)
Basophils Relative: 1 %
Eosinophils Absolute: 0.1 10*3/uL (ref 0.0–0.5)
Eosinophils Relative: 1 %
HCT: 40.3 % (ref 36.0–46.0)
Hemoglobin: 13.8 g/dL (ref 12.0–15.0)
Immature Granulocytes: 0 %
Lymphocytes Relative: 31 %
Lymphs Abs: 2.8 10*3/uL (ref 0.7–4.0)
MCH: 33.7 pg (ref 26.0–34.0)
MCHC: 34.2 g/dL (ref 30.0–36.0)
MCV: 98.3 fL (ref 80.0–100.0)
Monocytes Absolute: 1 10*3/uL (ref 0.1–1.0)
Monocytes Relative: 11 %
Neutro Abs: 5.1 10*3/uL (ref 1.7–7.7)
Neutrophils Relative %: 56 %
Platelet Count: 317 10*3/uL (ref 150–400)
RBC: 4.1 MIL/uL (ref 3.87–5.11)
RDW: 12.1 % (ref 11.5–15.5)
WBC Count: 9.1 10*3/uL (ref 4.0–10.5)
nRBC: 0 % (ref 0.0–0.2)

## 2019-05-19 LAB — LACTATE DEHYDROGENASE: LDH: 170 U/L (ref 98–192)

## 2019-05-19 NOTE — Progress Notes (Signed)
[place] [phone]   Fax:(336) [phone]  OFFICE PROGRESS NOTE  [name], DO [place] [address]  DIAGNOSIS:Essential thrombocythemia diagnosed in May 2019. Positive for JAK2 mutation.  PRIOR THERAPY:None  CURRENT THERAPY:Hydroxyurea 500 mg daily. The first dose was started on 01/01/2018.  INTERVAL HISTORY: Darlene Davis 43 y.o. female returns to the clinic today for 3 months follow-up visit.  The patient is feeling fine today with no concerning complaints.  She denied having any nausea, vomiting, diarrhea or constipation.  She has no bleeding issues.  She has no chest pain, shortness of breath, cough or hemoptysis.  She has no weight loss or night sweats.  She has been tolerating her treatment with hydroxyurea fairly well.  She is here today for evaluation and repeat blood work.  MEDICAL HISTORY: Past Medical History:  Diagnosis Date  . Essential thrombocythemia ([place]) 01/01/2018  . 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  MEDICATIONS:  Current Outpatient Medications  Medication Sig Dispense Refill  . acyclovir (ZOVIRAX) 400 MG tablet     . Ascorbic Acid (VITAMIN C PO) Take by mouth.    . Cholecalciferol (VITAMIN D PO) Take by mouth.    . hydroxyurea (HYDREA) 500 MG capsule TAKE 1 CAPSULE BY MOUTH  DAILY 90 capsule 1  . Multiple Vitamins-Iron (MULTIVITAMINS WITH IRON) TABS tablet Take 1 tablet by mouth daily.    . prochlorperazine (COMPAZINE) 10 MG tablet Take 1 tablet (10 mg total) by mouth every 6 (six) hours as needed for nausea or vomiting. 30 tablet 0  . SPRINTEC 28 0.25-35 MG-MCG tablet Take 1 tablet by mouth daily.     No current facility-administered medications for this visit.     SURGICAL HISTORY:  Past Surgical History:  Procedure Laterality Date  . NO PAST SURGERIES      REVIEW OF SYSTEMS:  A comprehensive review of systems was  negative.   PHYSICAL EXAMINATION: General appearance: alert, cooperative and no distress Head: Normocephalic, without obvious abnormality, atraumatic Neck: no adenopathy, no JVD, supple, symmetrical, trachea midline and thyroid not enlarged, symmetric, no tenderness/mass/nodules Lymph nodes: Cervical, supraclavicular, and axillary nodes normal. Resp: clear to auscultation bilaterally Back: symmetric, no curvature. ROM normal. No CVA tenderness. Cardio: regular rate and rhythm, S1, S2 normal, no murmur, click, rub or gallop GI: soft, non-tender; bowel sounds normal; no masses,  no organomegaly Extremities: extremities normal, atraumatic, no cyanosis or edema  ECOG PERFORMANCE STATUS: 0 - Asymptomatic  Blood pressure 124/78, pulse 80, temperature 98.5 F (36.9 C), temperature source Oral, resp. rate 18, height 5' 2.25" (1.581 m), weight 133 lb (60.3 kg), SpO2 100 %.  LABORATORY DATA: Lab Results  Component Value Date   WBC 9.1 05/19/2019   HGB 13.8 05/19/2019   HCT 40.3 05/19/2019   MCV 98.3 05/19/2019   PLT 317 05/19/2019      Chemistry      Component Value Date/Time   NA 141 01/14/2019 0821   K 4.7 01/14/2019 0821   CL 103 01/14/2019 0821   CO2 28 01/14/2019 0821   BUN 7 01/14/2019 0821   CREATININE 1.09 (H) 01/14/2019 0821      Component Value Date/Time   CALCIUM 9.4 01/14/2019 0821   ALKPHOS 50 01/14/2019 0821   AST 21 01/14/2019 0821   ALT 19 01/14/2019 0821   BILITOT 1.4 (H) 01/14/2019 [id number]  RADIOGRAPHIC STUDIES: No results found.  ASSESSMENT AND PLAN: This is a very pleasant 43 years old African-American female with essential thrombocythemia and currently on treatment with hydroxyurea 500 mg p.o. daily.   The patient has been tolerating this treatment well with no concerning adverse effects. CBC today is unremarkable for any abnormality.  Comprehensive metabolic panel and LDH are still pending. I recommended for the patient to continue her current  treatment with hydroxyurea with the same dose. I will see her back for follow-up visit in 3 months for evaluation with repeat CBC, comprehensive metabolic panel and LDH. She was advised to call immediately if she has any concerning symptoms in the interval. The patient voices understanding of current disease status and treatment options and is in agreement with the current care plan. All questions were answered. The patient knows to call the clinic with any problems, questions or concerns. We can certainly see the patient much sooner if necessary.  I spent 10 minutes counseling the patient face to face. The total time spent in the appointment was 15 minutes.  Disclaimer: This note was dictated with voice recognition software. Similar sounding words can inadvertently be transcribed and may not be corrected upon review.

## 2019-05-20 ENCOUNTER — Telehealth: Payer: Self-pay | Admitting: Internal Medicine

## 2019-05-20 NOTE — Telephone Encounter (Signed)
Scheduled appt per 10/13 los - mailed reminder letter with appt date and time   

## 2019-05-21 ENCOUNTER — Other Ambulatory Visit: Payer: 59

## 2019-08-15 ENCOUNTER — Other Ambulatory Visit: Payer: Self-pay

## 2019-08-15 DIAGNOSIS — Z20822 Contact with and (suspected) exposure to covid-19: Secondary | ICD-10-CM

## 2019-08-16 LAB — NOVEL CORONAVIRUS, NAA: SARS-CoV-2, NAA: NOT DETECTED

## 2019-08-20 ENCOUNTER — Encounter: Payer: Self-pay | Admitting: Internal Medicine

## 2019-08-20 ENCOUNTER — Other Ambulatory Visit: Payer: Self-pay

## 2019-08-20 ENCOUNTER — Inpatient Hospital Stay: Payer: 59 | Attending: Internal Medicine | Admitting: Internal Medicine

## 2019-08-20 ENCOUNTER — Inpatient Hospital Stay: Payer: 59

## 2019-08-20 VITALS — BP 121/86 | HR 85 | Temp 97.8°F | Resp 16 | Ht 62.25 in | Wt 131.6 lb

## 2019-08-20 DIAGNOSIS — D473 Essential (hemorrhagic) thrombocythemia: Secondary | ICD-10-CM

## 2019-08-20 LAB — CMP (CANCER CENTER ONLY)
ALT: 18 U/L (ref 0–44)
AST: 22 U/L (ref 15–41)
Albumin: 4.3 g/dL (ref 3.5–5.0)
Alkaline Phosphatase: 56 U/L (ref 38–126)
Anion gap: 10 (ref 5–15)
BUN: 11 mg/dL (ref 6–20)
CO2: 28 mmol/L (ref 22–32)
Calcium: 8.9 mg/dL (ref 8.9–10.3)
Chloride: 102 mmol/L (ref 98–111)
Creatinine: 1.06 mg/dL — ABNORMAL HIGH (ref 0.44–1.00)
GFR, Est AFR Am: >60 mL/min (ref >60)
GFR, Estimated: >60 mL/min (ref >60)
Glucose, Bld: 82 mg/dL (ref 70–99)
Potassium: 4 mmol/L (ref 3.5–5.1)
Sodium: 140 mmol/L (ref 135–145)
Total Bilirubin: 1.2 mg/dL (ref 0.3–1.2)
Total Protein: 7.9 g/dL (ref 6.5–8.1)

## 2019-08-20 LAB — CBC WITH DIFFERENTIAL (CANCER CENTER ONLY)
Abs Immature Granulocytes: 0.01 10*3/uL (ref 0.00–0.07)
Basophils Absolute: 0 10*3/uL (ref 0.0–0.1)
Basophils Relative: 1 %
Eosinophils Absolute: 0.1 10*3/uL (ref 0.0–0.5)
Eosinophils Relative: 1 %
HCT: 41 % (ref 36.0–46.0)
Hemoglobin: 13.8 g/dL (ref 12.0–15.0)
Immature Granulocytes: 0 %
Lymphocytes Relative: 44 %
Lymphs Abs: 2.7 10*3/uL (ref 0.7–4.0)
MCH: 33.1 pg (ref 26.0–34.0)
MCHC: 33.7 g/dL (ref 30.0–36.0)
MCV: 98.3 fL (ref 80.0–100.0)
Monocytes Absolute: 0.6 10*3/uL (ref 0.1–1.0)
Monocytes Relative: 10 %
Neutro Abs: 2.8 10*3/uL (ref 1.7–7.7)
Neutrophils Relative %: 44 %
Platelet Count: 302 10*3/uL (ref 150–400)
RBC: 4.17 MIL/uL (ref 3.87–5.11)
RDW: 11.9 % (ref 11.5–15.5)
WBC Count: 6.2 10*3/uL (ref 4.0–10.5)
nRBC: 0 % (ref 0.0–0.2)

## 2019-08-20 LAB — LACTATE DEHYDROGENASE: LDH: 252 U/L — ABNORMAL HIGH (ref 98–192)

## 2019-08-20 NOTE — Progress Notes (Signed)
[place] [phone]   Fax:([phone]  OFFICE PROGRESS NOTE  Darlene Nutting, DO [place] [address]  DIAGNOSIS:Essential thrombocythemia diagnosed in May 2019. Positive for JAK2 mutation.  PRIOR THERAPY:None  CURRENT THERAPY:Hydroxyurea 500 mg daily. The first dose was started on 01/01/2018.  INTERVAL HISTORY: Darlene Davis 44 y.o. female returns to the clinic today for 3 months follow-up visit.  The patient is feeling fine today with no concerning complaints.  She denied having any chest pain, shortness of breath, cough or hemoptysis.  She denied having any weight loss or night sweats.  She has no nausea, vomiting, diarrhea or constipation.  She denied having any headache or visual changes.  The patient continues to tolerate her hydroxyurea fairly well.  She is here today for evaluation and repeat blood work.  MEDICAL HISTORY: Past Medical History:  Diagnosis Date  . Essential thrombocythemia ([place]) 01/01/2018  . 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  MEDICATIONS:  Current Outpatient Medications  Medication Sig Dispense Refill  . acyclovir (ZOVIRAX) 400 MG tablet     . Ascorbic Acid (VITAMIN C PO) Take by mouth.    . Cholecalciferol (VITAMIN D PO) Take by mouth.    . hydroxyurea (HYDREA) 500 MG capsule TAKE 1 CAPSULE BY MOUTH  DAILY 90 capsule 1  . Multiple Vitamins-Iron (MULTIVITAMINS WITH IRON) TABS tablet Take 1 tablet by mouth daily.    . prochlorperazine (COMPAZINE) 10 MG tablet Take 1 tablet (10 mg total) by mouth every 6 (six) hours as needed for nausea or vomiting. 30 tablet 0  . SPRINTEC 28 0.25-35 MG-MCG tablet Take 1 tablet by mouth daily.     No current facility-administered medications for this visit.    SURGICAL HISTORY:  Past Surgical History:  Procedure Laterality Date  . NO PAST SURGERIES      REVIEW OF SYSTEMS:  A  comprehensive review of systems was negative.   PHYSICAL EXAMINATION: General appearance: alert, cooperative and no distress Head: Normocephalic, without obvious abnormality, atraumatic Neck: no adenopathy, no JVD, supple, symmetrical, trachea midline and thyroid not enlarged, symmetric, no tenderness/mass/nodules Lymph nodes: Cervical, supraclavicular, and axillary nodes normal. Resp: clear to auscultation bilaterally Back: symmetric, no curvature. ROM normal. No CVA tenderness. Cardio: regular rate and rhythm, S1, S2 normal, no murmur, click, rub or gallop GI: soft, non-tender; bowel sounds normal; no masses,  no organomegaly Extremities: extremities normal, atraumatic, no cyanosis or edema  ECOG PERFORMANCE STATUS: 0 - Asymptomatic  Blood pressure 121/86, pulse 85, temperature 97.8 F (36.6 C), temperature source Temporal, resp. rate 16, height 5' 2.25" (1.581 m), weight 131 lb 9.6 oz (59.7 kg), SpO2 100 %.  LABORATORY DATA: Lab Results  Component Value Date   WBC 6.2 08/20/2019   HGB 13.8 08/20/2019   HCT 41.0 08/20/2019   MCV 98.3 08/20/2019   PLT 302 08/20/2019      Chemistry      Component Value Date/Time   NA 143 05/19/2019 0823   K 4.0 05/19/2019 0823   CL 103 05/19/2019 0823   CO2 30 05/19/2019 0823   BUN 11 05/19/2019 0823   CREATININE 0.98 05/19/2019 0823      Component Value Date/Time   CALCIUM 9.6 05/19/2019 0823   ALKPHOS 54 05/19/2019 0823   AST 20 05/19/2019 0823   ALT 15 05/19/2019 0823   BILITOT 1.4 (H) 05/19/2019 [id number]  RADIOGRAPHIC STUDIES: No results found.  ASSESSMENT AND PLAN: This is a very pleasant 44 years old African-American female with essential thrombocythemia and currently on treatment with hydroxyurea 500 mg p.o. daily.   She has been tolerating this treatment well with no concerning adverse effects. Repeat CBC today is unremarkable.  Comprehensive metabolic panel and LDH are still pending. I recommended for the patient to  continue her current treatment with hydroxyurea with the same dose.  I will see her back for follow-up visit in 3 months for evaluation with repeat blood work. She was advised to call immediately if she has any concerning symptoms in the interval. The patient voices understanding of current disease status and treatment options and is in agreement with the current care plan. All questions were answered. The patient knows to call the clinic with any problems, questions or concerns. We can certainly see the patient much sooner if necessary.   Disclaimer: This note was dictated with voice recognition software. Similar sounding words can inadvertently be transcribed and may not be corrected upon review.

## 2019-08-21 ENCOUNTER — Telehealth: Payer: Self-pay | Admitting: Internal Medicine

## 2019-08-21 NOTE — Telephone Encounter (Signed)
Scheduled per 1/14 los. Called and left msg. Mailing printout 

## 2019-08-21 NOTE — Telephone Encounter (Signed)
Returned patient's phone call regarding rescheduling 04/15 appointment time, per patient's request appointment time has been rescheduled.

## 2019-10-14 ENCOUNTER — Other Ambulatory Visit: Payer: Self-pay | Admitting: Internal Medicine

## 2019-10-14 DIAGNOSIS — D473 Essential (hemorrhagic) thrombocythemia: Secondary | ICD-10-CM

## 2019-10-14 NOTE — Telephone Encounter (Signed)
Hydrea refill

## 2019-11-19 ENCOUNTER — Other Ambulatory Visit: Payer: 59

## 2019-11-19 ENCOUNTER — Ambulatory Visit: Payer: 59 | Admitting: Internal Medicine

## 2019-11-19 ENCOUNTER — Inpatient Hospital Stay: Payer: 59 | Attending: Internal Medicine

## 2019-11-19 ENCOUNTER — Encounter: Payer: Self-pay | Admitting: Internal Medicine

## 2019-11-19 ENCOUNTER — Other Ambulatory Visit: Payer: Self-pay

## 2019-11-19 ENCOUNTER — Inpatient Hospital Stay (HOSPITAL_BASED_OUTPATIENT_CLINIC_OR_DEPARTMENT_OTHER): Payer: 59 | Admitting: Internal Medicine

## 2019-11-19 VITALS — BP 130/82 | HR 70 | Temp 98.0°F | Resp 18 | Ht 62.25 in | Wt 127.9 lb

## 2019-11-19 DIAGNOSIS — D473 Essential (hemorrhagic) thrombocythemia: Secondary | ICD-10-CM | POA: Diagnosis not present

## 2019-11-19 LAB — CMP (CANCER CENTER ONLY)
ALT: 26 U/L (ref 0–44)
AST: 20 U/L (ref 15–41)
Albumin: 3.9 g/dL (ref 3.5–5.0)
Alkaline Phosphatase: 62 U/L (ref 38–126)
Anion gap: 8 (ref 5–15)
BUN: 8 mg/dL (ref 6–20)
CO2: 28 mmol/L (ref 22–32)
Calcium: 9 mg/dL (ref 8.9–10.3)
Chloride: 103 mmol/L (ref 98–111)
Creatinine: 1.01 mg/dL — ABNORMAL HIGH (ref 0.44–1.00)
GFR, Est AFR Am: >60 mL/min (ref >60)
GFR, Estimated: >60 mL/min (ref >60)
Glucose, Bld: 80 mg/dL (ref 70–99)
Potassium: 3.7 mmol/L (ref 3.5–5.1)
Sodium: 139 mmol/L (ref 135–145)
Total Bilirubin: 1.5 mg/dL — ABNORMAL HIGH (ref 0.3–1.2)
Total Protein: 7.5 g/dL (ref 6.5–8.1)

## 2019-11-19 LAB — CBC WITH DIFFERENTIAL (CANCER CENTER ONLY)
Abs Immature Granulocytes: 0 10*3/uL (ref 0.00–0.07)
Basophils Absolute: 0 10*3/uL (ref 0.0–0.1)
Basophils Relative: 1 %
Eosinophils Absolute: 0.1 10*3/uL (ref 0.0–0.5)
Eosinophils Relative: 1 %
HCT: 39.5 % (ref 36.0–46.0)
Hemoglobin: 13 g/dL (ref 12.0–15.0)
Immature Granulocytes: 0 %
Lymphocytes Relative: 37 %
Lymphs Abs: 2.4 10*3/uL (ref 0.7–4.0)
MCH: 33 pg (ref 26.0–34.0)
MCHC: 32.9 g/dL (ref 30.0–36.0)
MCV: 100.3 fL — ABNORMAL HIGH (ref 80.0–100.0)
Monocytes Absolute: 0.7 10*3/uL (ref 0.1–1.0)
Monocytes Relative: 11 %
Neutro Abs: 3.3 10*3/uL (ref 1.7–7.7)
Neutrophils Relative %: 50 %
Platelet Count: 258 10*3/uL (ref 150–400)
RBC: 3.94 MIL/uL (ref 3.87–5.11)
RDW: 12.6 % (ref 11.5–15.5)
WBC Count: 6.5 10*3/uL (ref 4.0–10.5)
nRBC: 0 % (ref 0.0–0.2)

## 2019-11-19 LAB — LACTATE DEHYDROGENASE: LDH: 169 U/L (ref 98–192)

## 2019-11-19 NOTE — Progress Notes (Signed)
[facility] Telephone:([phone]) [phone]   Fax:(336) [place], MD [address] [place] [address]  DIAGNOSIS:Essential thrombocythemia diagnosed in May 2019. Positive for JAK2 mutation.  PRIOR THERAPY:None  CURRENT THERAPY:Hydroxyurea 500 mg daily. The first dose was started on 01/01/2018.  INTERVAL HISTORY: Darlene Davis 44 y.o. female returns to the clinic today for follow-up visit.  The patient is feeling fine today with no concerning complaints.  She denied having any chest pain, shortness of breath, cough or hemoptysis.  She denied having any fatigue or weakness.  She has no nausea, vomiting, diarrhea or constipation.  She has no bleeding issues.  She received her 2 Covid vaccines and did well.  She continues to tolerate her treatment with hydroxyurea fairly well.  She is here today for evaluation and repeat blood work.  MEDICAL HISTORY: Past Medical History:  Diagnosis Date  . Essential thrombocythemia ([place]) 01/01/2018  . 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  MEDICATIONS:  Current Outpatient Medications  Medication Sig Dispense Refill  . hydroxyurea (HYDREA) 500 MG capsule TAKE 1 CAPSULE BY MOUTH  DAILY 90 capsule 1  . acyclovir (ZOVIRAX) 400 MG tablet     . Ascorbic Acid (VITAMIN C PO) Take by mouth.    . Cholecalciferol (VITAMIN D PO) Take by mouth.    . Multiple Vitamins-Iron (MULTIVITAMINS WITH IRON) TABS tablet Take 1 tablet by mouth daily.    . prochlorperazine (COMPAZINE) 10 MG tablet Take 1 tablet (10 mg total) by mouth every 6 (six) hours as needed for nausea or vomiting. 30 tablet 0  . SPRINTEC 28 0.25-35 MG-MCG tablet Take 1 tablet by mouth daily.     No current facility-administered medications for this visit.    SURGICAL HISTORY:  Past Surgical History:  Procedure Laterality Date  . NO PAST SURGERIES      REVIEW OF  SYSTEMS:  A comprehensive review of systems was negative.   PHYSICAL EXAMINATION: General appearance: alert, cooperative and no distress Head: Normocephalic, without obvious abnormality, atraumatic Neck: no adenopathy, no JVD, supple, symmetrical, trachea midline and thyroid not enlarged, symmetric, no tenderness/mass/nodules Lymph nodes: Cervical, supraclavicular, and axillary nodes normal. Resp: clear to auscultation bilaterally Back: symmetric, no curvature. ROM normal. No CVA tenderness. Cardio: regular rate and rhythm, S1, S2 normal, no murmur, click, rub or gallop GI: soft, non-tender; bowel sounds normal; no masses,  no organomegaly Extremities: extremities normal, atraumatic, no cyanosis or edema  ECOG PERFORMANCE STATUS: 0 - Asymptomatic  Blood pressure 130/82, pulse 70, temperature 98 F (36.7 C), temperature source Oral, resp. rate 18, height 5' 2.25" (1.581 m), weight 127 lb 14.4 oz (58 kg), SpO2 100 %.  LABORATORY DATA: Lab Results  Component Value Date   WBC 6.5 11/19/2019   HGB 13.0 11/19/2019   HCT 39.5 11/19/2019   MCV 100.3 (H) 11/19/2019   PLT 258 11/19/2019      Chemistry      Component Value Date/Time   NA 140 08/20/2019 0825   K 4.0 08/20/2019 0825   CL 102 08/20/2019 0825   CO2 28 08/20/2019 0825   BUN 11 08/20/2019 0825   CREATININE 1.06 (H) 08/20/2019 0825      Component Value Date/Time   CALCIUM 8.9 08/20/2019 0825   ALKPHOS 56 08/20/2019 0825   AST 22 08/20/2019 0825   ALT 18  08/20/2019 0825   BILITOT 1.2 08/20/2019 0825       RADIOGRAPHIC STUDIES: No results found.  ASSESSMENT AND PLAN: This is a very pleasant 44 years old African-American female with essential thrombocythemia and currently on treatment with hydroxyurea 500 mg p.o. daily.   The patient has been tolerating this treatment well with no concerning adverse effects. CBC today is unremarkable and comprehensive metabolic panel showed mild elevation of her serum bilirubin. I  recommended for the patient to continue her current treatment with hydroxyurea.  I also recommended for her to take folic acid few times a week. I will see her back for follow-up visit in 3 months for evaluation with repeat CBC, comprehensive metabolic panel and LDH. The patient voices understanding of current disease status and treatment options and is in agreement with the current care plan. All questions were answered. The patient knows to call the clinic with any problems, questions or concerns. We can certainly see the patient much sooner if necessary.   Disclaimer: This note was dictated with voice recognition software. Similar sounding words can inadvertently be transcribed and may not be corrected upon review.

## 2020-01-20 ENCOUNTER — Ambulatory Visit (INDEPENDENT_AMBULATORY_CARE_PROVIDER_SITE_OTHER): Payer: 59 | Admitting: Internal Medicine

## 2020-01-20 ENCOUNTER — Other Ambulatory Visit: Payer: Self-pay

## 2020-01-20 ENCOUNTER — Encounter: Payer: Self-pay | Admitting: Internal Medicine

## 2020-01-20 VITALS — BP 115/77 | HR 75 | Temp 97.5°F | Resp 16 | Ht 62.0 in | Wt 124.5 lb

## 2020-01-20 DIAGNOSIS — Z0000 Encounter for general adult medical examination without abnormal findings: Secondary | ICD-10-CM

## 2020-01-20 DIAGNOSIS — Z23 Encounter for immunization: Secondary | ICD-10-CM

## 2020-01-20 NOTE — Patient Instructions (Signed)
Think about a tetanus  booster: Tdap  Think about colon cancer screening: alternatives - Cologuard - simple stool test  -colonoscopy  GO TO THE FRONT DESK, PLEASE SCHEDULE YOUR APPOINTMENTS Come back for for a physical exam in 1 year     Colorectal Cancer Screening  Colorectal cancer screening is a group of tests that are used to check for colorectal cancer before symptoms develop. Colorectal refers to the colon and rectum. The colon and rectum are located at the end of the digestive tract and carry bowel movements out of the body. Who should have screening? All adults starting at age 28 until age 102 should have screening. Your health care provider may recommend screening at age 32. You will have tests every 1-10 years, depending on your results and the type of screening test. You may have screening tests starting at an earlier age, or more frequently than other people, if you have any of the following risk factors:  A personal or family history of colorectal cancer or abnormal growths (polyps).  Inflammatory bowel disease, such as ulcerative colitis or Crohn's disease.  A history of having radiation treatment to the abdomen or pelvic area for cancer.  Colorectal cancer symptoms, such as changes in bowel habits or blood in your stool.  A type of colon cancer syndrome that is passed from parent to child (hereditary), such as: ? Lynch syndrome. ? Familial adenomatous polyposis. ? Turcot syndrome. ? Peutz-Jeghers syndrome. Screening recommendations for adults who are 8-53 years old vary depending on health. How is screening done? There are several types of colorectal screening tests. You may have one or more of the following:  Guaiac-based fecal occult blood testing. For this test, a stool (feces) sample is checked for hidden (occult) blood, which could be a sign of colorectal cancer.  Fecal immunochemical test (FIT). For this test, a stool sample is checked for blood, which could  be a sign of colorectal cancer.  Stool DNA test. For this test, a stool sample is checked for blood and changes in DNA that could lead to colorectal cancer.  Sigmoidoscopy. During this test, a thin, flexible tube with a camera on the end (sigmoidoscope) is used to examine the rectum and the lower colon.  Colonoscopy. During this test, a long, flexible tube with a camera on the end (colonoscope) is used to examine the entire colon and rectum. With a colonoscopy, it is possible to take a sample of tissue (biopsy) and remove small polyps during the test.  Virtual colonoscopy. Instead of a colonoscope, this type of colonoscopy uses X-rays (CT scan) and computers to produce images of the colon and rectum. What are the benefits of screening? Screening reduces your risk for colorectal cancer and can help identify cancer at an early stage, when the cancer can be removed or treated more easily. It is common for polyps to form in the lining of the colon, especially as you age. These polyps may be cancerous or become cancerous over time. Screening can identify these polyps. What are the risks of screening? Each screening test may have different risks.  Stool sample tests have fewer risks than other types of screening tests. However, you may need more tests to confirm results from a stool sample test.  Screening tests that involve X-rays expose you to low levels of radiation, which may slightly increase your cancer risk. The benefit of detecting cancer outweighs the slight increase in risk.  Screening tests such as sigmoidoscopy and colonoscopy may place you  at risk for bleeding, intestinal damage, infection, or a reaction to medicines given during the exam. Talk with your health care provider to understand your risk for colorectal cancer and to make a screening plan that is right for you. Questions to ask your health care provider  When should I start colorectal cancer screening?  What is my risk for  colorectal cancer?  How often do I need screening?  Which screening tests do I need?  How do I get my test results?  What do my results mean? Where to find more information Learn more about colorectal cancer screening from:  [employer]: www.cancer.org  The [employer]: www.cancer.gov Summary  Colorectal cancer screening is a group of tests used to check for colorectal cancer before symptoms develop.  Screening reduces your risk for colorectal cancer and can help identify cancer at an early stage, when the cancer can be removed or treated more easily.  All adults starting at age 2 until age 57 should have screening. Your health care provider may recommend screening at age 59.  You may have screening tests starting at an earlier age, or more frequently than other people, if you have certain risk factors.  Talk with your health care provider to understand your risk for colorectal cancer and to make a screening plan that is right for you. This information is not intended to replace advice given to you by your health care provider. Make sure you discuss any questions you have with your health care provider. Document Revised: 11/12/2018 Document Reviewed: 04/24/2017 Elsevier Patient Education  [employer].

## 2020-01-20 NOTE — Progress Notes (Signed)
Subjective:    Patient ID: Darlene Davis, female    DOB: 14-Jul-1976, 44 y.o.   MRN: [id number]  DOS:  01/20/2020 Type of visit - description: CPX, new patient to me. Has no concerns  Review of Systems  A 14 point review of systems is negative   Past Medical History:  Diagnosis Date  . Essential thrombocythemia ([place]) 01/01/2018  . 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 NO PAST SURGERIES      Social History   Socioeconomic History  . Marital status: Single    Spouse name: Not on file  . Number of children: 0  . Years of education: Not on file  . Highest education level: Not on file  Occupational History  . Occupation: [employer]  Tobacco Use  . Smoking status: Never Smoker  . Smokeless tobacco: Never Used  . Tobacco comment:    Vaping Use  . Vaping Use: Never used  Substance and Sexual Activity  . Alcohol use: Not Currently    Comment: rarely   . Drug use: Never  . Sexual activity: Not on file  Other Topics Concern  . Not on file  Social History Narrative   Lived by herself   Social Determinants of Health   Financial Resource Strain: Low Risk   . Difficulty of Paying Living Expenses: Not hard at all  Food Insecurity: No Food Insecurity  . Worried About Charity fundraiser in the Last Year: Never true  . Ran Out of Food in the Last Year: Never true  Transportation Needs: No Transportation Needs  . Lack of Transportation (Medical): No  . Lack of Transportation (Non-Medical): No  Physical Activity: Insufficiently Active  . Days of Exercise per Week: 4 days  . Minutes of Exercise per Session: 30 min  Stress: No Stress Concern Present  . Feeling of Stress : Only a little  Social Connections: Unknown  . Frequency of Communication with Friends and Family: Three times a week  . Frequency of Social Gatherings with Friends and Family: Three times a week  . Attends  Religious Services: Not on file  . Active Member of Clubs or Organizations: Not on file  . Attends Archivist Meetings: Not on file  . Marital Status: Never married  Intimate Partner Violence: Not At Risk  . Fear of Current or Ex-Partner: No  . Emotionally Abused: No  . Physically Abused: No  . Sexually Abused: No     Family History  Problem Relation Age of Onset  . Hyperlipidemia Maternal Uncle   . Diabetes Maternal Grandmother   . Hypertension Brother   . Colon cancer Cousin 40       liver, hx EtOH  . Colon cancer Paternal Uncle 30  . Breast cancer Neg Hx   . CAD Neg Hx      Allergies as of 01/20/2020   No Known Allergies     Medication List       Accurate as of January 20, 2020 11:59 PM. If you have any questions, ask your nurse or doctor.        acyclovir 400 MG tablet Commonly known as: ZOVIRAX   Folate 400 MCG tablet Generic drug: folic acid Take 827 mcg by mouth 3 (three) times a week.   hydroxyurea 500 MG capsule Commonly known as: HYDREA TAKE 1 CAPSULE BY MOUTH  DAILY  multivitamins with iron Tabs tablet Take 1 tablet by mouth daily.   prochlorperazine 10 MG tablet Commonly known as: COMPAZINE Take 1 tablet (10 mg total) by mouth every 6 (six) hours as needed for nausea or vomiting.   Sprintec 28 0.25-35 MG-MCG tablet Generic drug: norgestimate-ethinyl estradiol Take 1 tablet by mouth daily.   VITAMIN C PO Take by mouth.   VITAMIN D PO Take by mouth.          Objective:   Physical Exam BP 115/77 (BP Location: Left Arm, Patient Position: Sitting, Cuff Size: Small)   Pulse 75   Temp (!) 97.5 F (36.4 C) (Temporal)   Resp 16   Ht 5\' 2"  (1.575 m)   Wt 124 lb 8 oz (56.5 kg)   LMP 01/11/2020 (Exact Date)   SpO2 100%   BMI 22.77 kg/m  General: Well developed, NAD, BMI noted Neck: No  thyromegaly  HEENT:  Normocephalic . Face symmetric, atraumatic Lungs:  CTA B Normal respiratory effort, no intercostal retractions, no  accessory muscle use. Heart: RRR,  no murmur.  Abdomen:  Not distended, soft, non-tender. No rebound or rigidity.   Lower extremities: no pretibial edema bilaterally  Skin: Exposed areas without rash. Not pale. Not jaundice Neurologic:  alert & oriented X3.  Speech normal, gait appropriate for age and unassisted Strength symmetric and appropriate for age.  Psych: Cognition and judgment appear intact.  Cooperative with normal attention span and concentration.  Behavior appropriate. No anxious or depressed appearing.     Assessment    Assessment Essential thrombocythemia, sees hem  PLAN Here for CPX Thrombocythemia: Follow-up by hematology RTC 1 year

## 2020-01-20 NOTE — Progress Notes (Signed)
Pre visit review using our clinic review tool, if applicable. No additional management support is needed unless otherwise documented below in the visit note.  9:14 AM- Pt came back into office requesting to go ahead w/ TDAP vaccine. Okay per Dr. Larose Kells.

## 2020-01-21 ENCOUNTER — Encounter: Payer: Self-pay | Admitting: Internal Medicine

## 2020-01-21 NOTE — Assessment & Plan Note (Signed)
Td: today S/p Pfizer covid x 2 Female Care : per gyn, Gustavo Lah MMG pending for later this month CCS: Three options discussed, will think about him Labs reviewed, no labs needed today. Diet exercise, she is doing very well.

## 2020-01-30 LAB — HM MAMMOGRAPHY

## 2020-02-01 ENCOUNTER — Encounter: Payer: Self-pay | Admitting: Internal Medicine

## 2020-02-18 ENCOUNTER — Encounter: Payer: Self-pay | Admitting: Internal Medicine

## 2020-02-18 ENCOUNTER — Inpatient Hospital Stay: Payer: 59 | Attending: Internal Medicine

## 2020-02-18 ENCOUNTER — Other Ambulatory Visit: Payer: 59

## 2020-02-18 ENCOUNTER — Inpatient Hospital Stay (HOSPITAL_BASED_OUTPATIENT_CLINIC_OR_DEPARTMENT_OTHER): Payer: 59 | Admitting: Internal Medicine

## 2020-02-18 ENCOUNTER — Other Ambulatory Visit: Payer: Self-pay

## 2020-02-18 ENCOUNTER — Ambulatory Visit: Payer: 59 | Admitting: Internal Medicine

## 2020-02-18 VITALS — BP 132/82 | HR 73 | Temp 97.3°F | Resp 18 | Ht 62.0 in | Wt 127.3 lb

## 2020-02-18 DIAGNOSIS — D473 Essential (hemorrhagic) thrombocythemia: Secondary | ICD-10-CM

## 2020-02-18 LAB — CMP (CANCER CENTER ONLY)
ALT: 19 U/L (ref 0–44)
AST: 25 U/L (ref 15–41)
Albumin: 3.7 g/dL (ref 3.5–5.0)
Alkaline Phosphatase: 52 U/L (ref 38–126)
Anion gap: 10 (ref 5–15)
BUN: 8 mg/dL (ref 6–20)
CO2: 29 mmol/L (ref 22–32)
Calcium: 9.5 mg/dL (ref 8.9–10.3)
Chloride: 104 mmol/L (ref 98–111)
Creatinine: 0.96 mg/dL (ref 0.44–1.00)
GFR, Est AFR Am: >60 mL/min (ref >60)
GFR, Estimated: >60 mL/min (ref >60)
Glucose, Bld: 90 mg/dL (ref 70–99)
Potassium: 4 mmol/L (ref 3.5–5.1)
Sodium: 143 mmol/L (ref 135–145)
Total Bilirubin: 1.1 mg/dL (ref 0.3–1.2)
Total Protein: 7.4 g/dL (ref 6.5–8.1)

## 2020-02-18 LAB — CBC WITH DIFFERENTIAL (CANCER CENTER ONLY)
Abs Immature Granulocytes: 0.02 10*3/uL (ref 0.00–0.07)
Basophils Absolute: 0 10*3/uL (ref 0.0–0.1)
Basophils Relative: 1 %
Eosinophils Absolute: 0.1 10*3/uL (ref 0.0–0.5)
Eosinophils Relative: 1 %
HCT: 39.9 % (ref 36.0–46.0)
Hemoglobin: 13.3 g/dL (ref 12.0–15.0)
Immature Granulocytes: 0 %
Lymphocytes Relative: 38 %
Lymphs Abs: 2.4 10*3/uL (ref 0.7–4.0)
MCH: 33.3 pg (ref 26.0–34.0)
MCHC: 33.3 g/dL (ref 30.0–36.0)
MCV: 99.8 fL (ref 80.0–100.0)
Monocytes Absolute: 0.7 10*3/uL (ref 0.1–1.0)
Monocytes Relative: 12 %
Neutro Abs: 3 10*3/uL (ref 1.7–7.7)
Neutrophils Relative %: 48 %
Platelet Count: 238 10*3/uL (ref 150–400)
RBC: 4 MIL/uL (ref 3.87–5.11)
RDW: 12.5 % (ref 11.5–15.5)
WBC Count: 6.3 10*3/uL (ref 4.0–10.5)
nRBC: 0 % (ref 0.0–0.2)

## 2020-02-18 LAB — LACTATE DEHYDROGENASE: LDH: 147 U/L (ref 98–192)

## 2020-02-18 NOTE — Progress Notes (Signed)
[facility] Telephone:([phone]   Fax:(336) [place], MD [address] [place] [address]  DIAGNOSIS:Essential thrombocythemia diagnosed in May 2019. Positive for JAK2 mutation.  PRIOR THERAPY:None  CURRENT THERAPY:Hydroxyurea 500 mg daily. The first dose was started on 01/01/2018.  INTERVAL HISTORY: Darlene Davis 44 y.o. female returns to the clinic today for 3 months follow-up visit.  The patient is feeling fine today with no concerning complaints.  She denied having any recent weight loss or night sweats.  She has no bleeding issues.  She has no chest pain, shortness of breath, cough or hemoptysis.  She continues to tolerate her treatment with hydroxyurea fairly well.  She is here today for evaluation and repeat blood work.   MEDICAL HISTORY: Past Medical History:  Diagnosis Date  . Essential thrombocythemia ([place]) 01/01/2018  . 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  MEDICATIONS:  Current Outpatient Medications  Medication Sig Dispense Refill  . acyclovir (ZOVIRAX) 400 MG tablet     . Ascorbic Acid (VITAMIN C PO) Take by mouth.    . Cholecalciferol (VITAMIN D PO) Take by mouth.    . folic acid (FOLATE) 062 MCG tablet Take 400 mcg by mouth 3 (three) times a week.    . hydroxyurea (HYDREA) 500 MG capsule TAKE 1 CAPSULE BY MOUTH  DAILY 90 capsule 1  . Multiple Vitamins-Iron (MULTIVITAMINS WITH IRON) TABS tablet Take 1 tablet by mouth daily.    . prochlorperazine (COMPAZINE) 10 MG tablet Take 1 tablet (10 mg total) by mouth every 6 (six) hours as needed for nausea or vomiting. 30 tablet 0  . SPRINTEC 28 0.25-35 MG-MCG tablet Take 1 tablet by mouth daily.     No current facility-administered medications for this visit.    SURGICAL HISTORY:  Past Surgical History:  Procedure Laterality Date  . NO PAST SURGERIES      REVIEW OF  SYSTEMS:  A comprehensive review of systems was negative.   PHYSICAL EXAMINATION: General appearance: alert, cooperative and no distress Head: Normocephalic, without obvious abnormality, atraumatic Neck: no adenopathy, no JVD, supple, symmetrical, trachea midline and thyroid not enlarged, symmetric, no tenderness/mass/nodules Lymph nodes: Cervical, supraclavicular, and axillary nodes normal. Resp: clear to auscultation bilaterally Back: symmetric, no curvature. ROM normal. No CVA tenderness. Cardio: regular rate and rhythm, S1, S2 normal, no murmur, click, rub or gallop GI: soft, non-tender; bowel sounds normal; no masses,  no organomegaly Extremities: extremities normal, atraumatic, no cyanosis or edema  ECOG PERFORMANCE STATUS: 0 - Asymptomatic  Blood pressure 132/82, pulse 73, temperature (!) 97.3 F (36.3 C), temperature source Temporal, resp. rate 18, height 5\' 2"  (1.575 m), weight 127 lb 4.8 oz (57.7 kg), SpO2 100 %.  LABORATORY DATA: Lab Results  Component Value Date   WBC 6.3 02/18/2020   HGB 13.3 02/18/2020   HCT 39.9 02/18/2020   MCV 99.8 02/18/2020   PLT 238 02/18/2020      Chemistry      Component Value Date/Time   NA 139 11/19/2019 0830   K 3.7 11/19/2019 0830   CL 103 11/19/2019 0830   CO2 28 11/19/2019 0830   BUN 8 11/19/2019 0830   CREATININE 1.01 (H) 11/19/2019 0830      Component Value Date/Time   CALCIUM 9.0 11/19/2019 0830   ALKPHOS 62 11/19/2019 0830   AST 20  11/19/2019 0830   ALT 26 11/19/2019 0830   BILITOT 1.5 (H) 11/19/2019 0830       RADIOGRAPHIC STUDIES: No results found.  ASSESSMENT AND PLAN: This is a very pleasant 44 years old African-American female with essential thrombocythemia and currently on treatment with hydroxyurea 500 mg p.o. daily.   She continues to tolerate this treatment well with no concerning complaints. CBC today is unremarkable for any abnormality. I recommended for the patient to continue her current treatment with  hydroxyurea 500 mg p.o. daily. I will see her back for follow-up visit in 3 months for evaluation with repeat CBC, comprehensive metabolic panel and LDH. She was advised to call immediately if she has any other concerning symptoms in the interval. The patient voices understanding of current disease status and treatment options and is in agreement with the current care plan. All questions were answered. The patient knows to call the clinic with any problems, questions or concerns. We can certainly see the patient much sooner if necessary.  Disclaimer: This note was dictated with voice recognition software. Similar sounding words can inadvertently be transcribed and may not be corrected upon review.

## 2020-02-24 ENCOUNTER — Telehealth: Payer: Self-pay | Admitting: Internal Medicine

## 2020-02-24 NOTE — Telephone Encounter (Signed)
Scheduled per los. Called and spoke with patient. Confirmed appt 

## 2020-03-03 ENCOUNTER — Other Ambulatory Visit: Payer: Self-pay | Admitting: Internal Medicine

## 2020-03-03 DIAGNOSIS — D473 Essential (hemorrhagic) thrombocythemia: Secondary | ICD-10-CM

## 2020-03-20 IMAGING — US US ABDOMEN COMPLETE
1 series · 14 of 25 positions shown · non-contrast
Comparison: None.

CLINICAL DATA: Elevated liver enzymes

EXAM:
ABDOMEN ULTRASOUND COMPLETE

[Series 1: us abdomen complete · 14 of 112 slices shown]
[im 1/112]
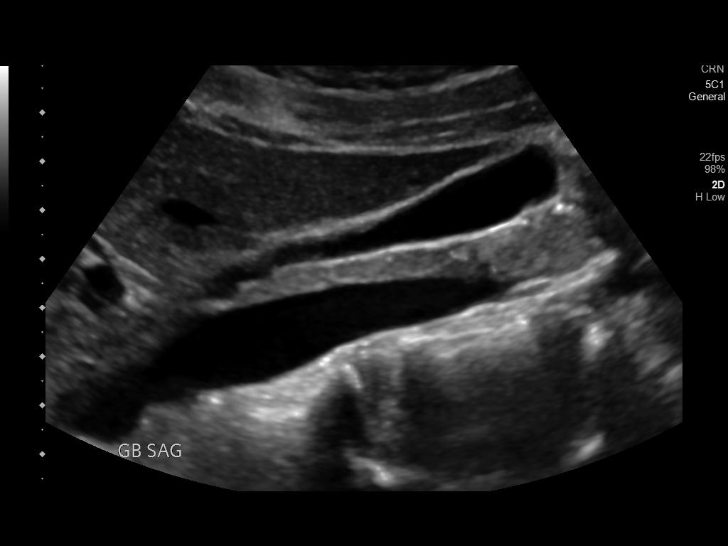
[im 10/112]
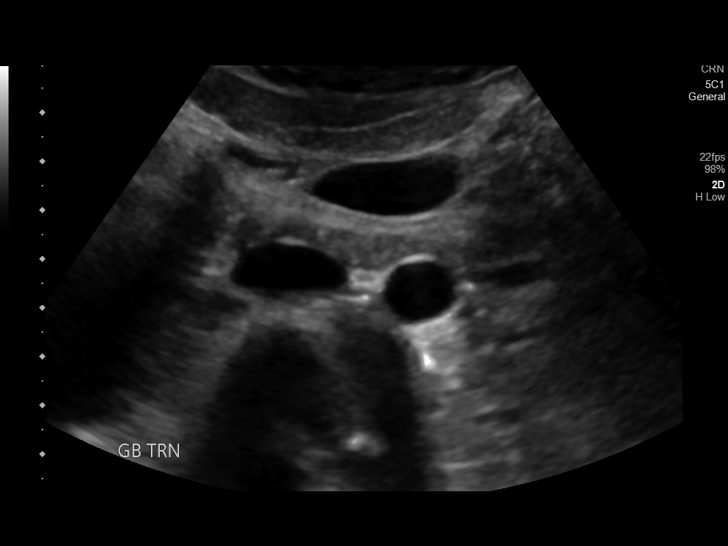
[im 19/112]
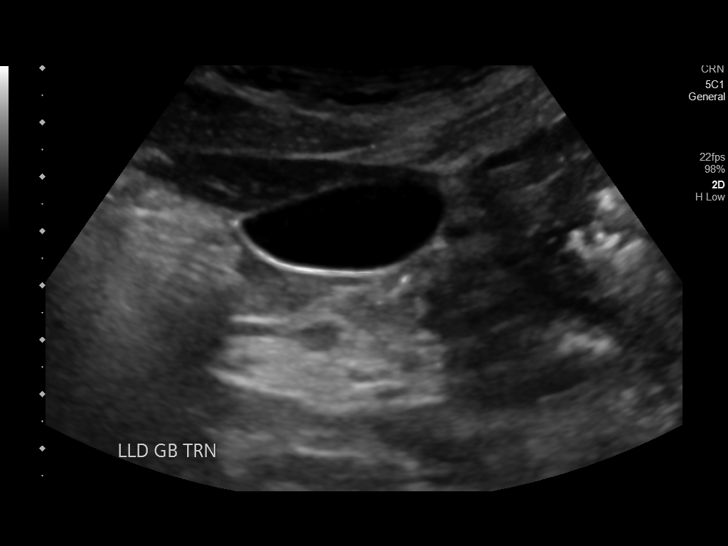
[im 28/112]
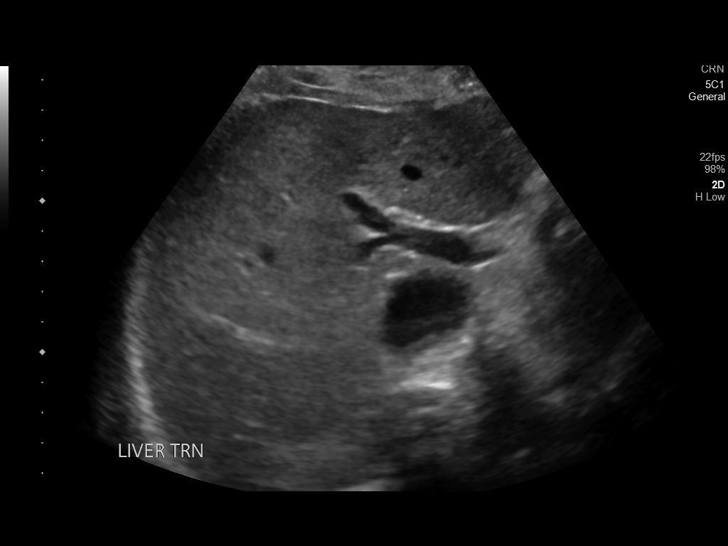
[im 38/112]
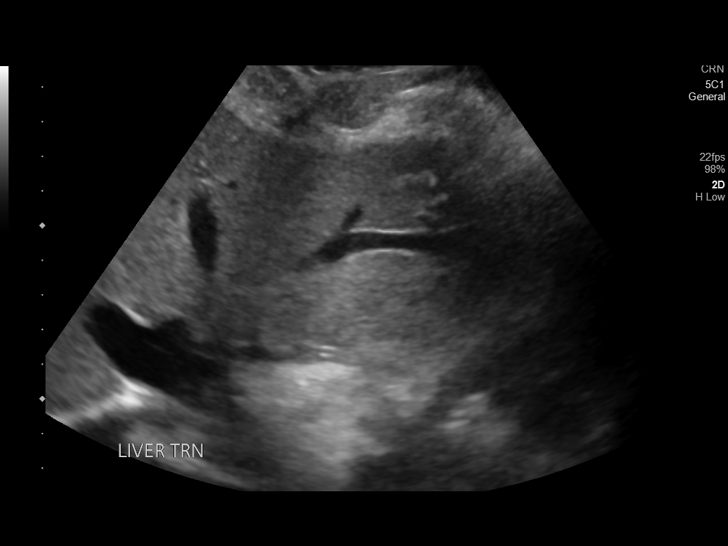
[im 42/112]
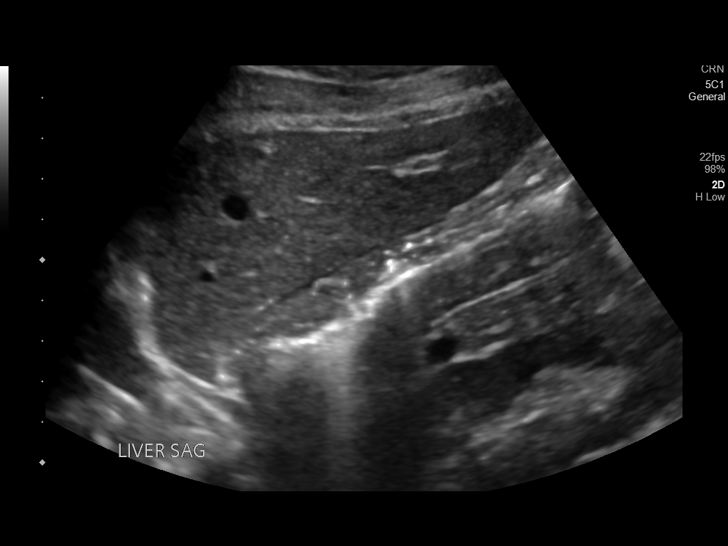
[im 51/112]
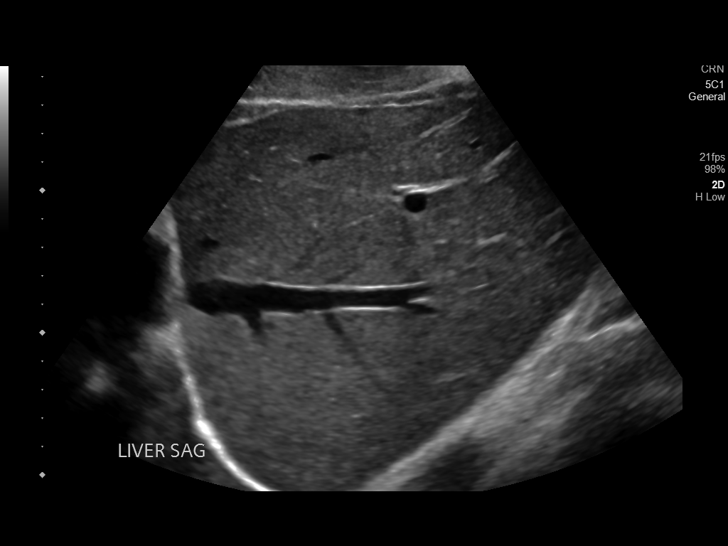
[im 61/112]
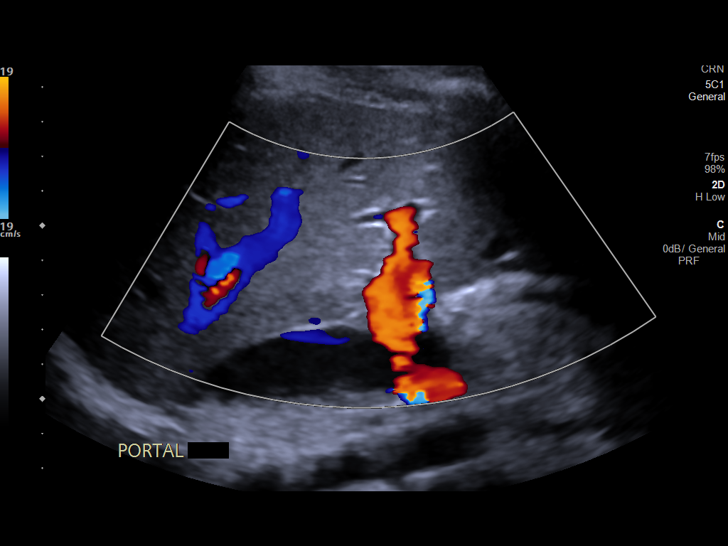
[im 70/112]
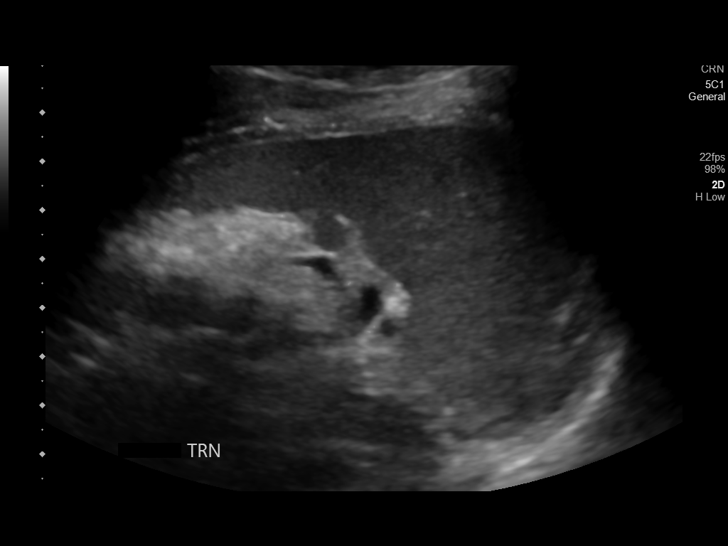
[im 75/112]
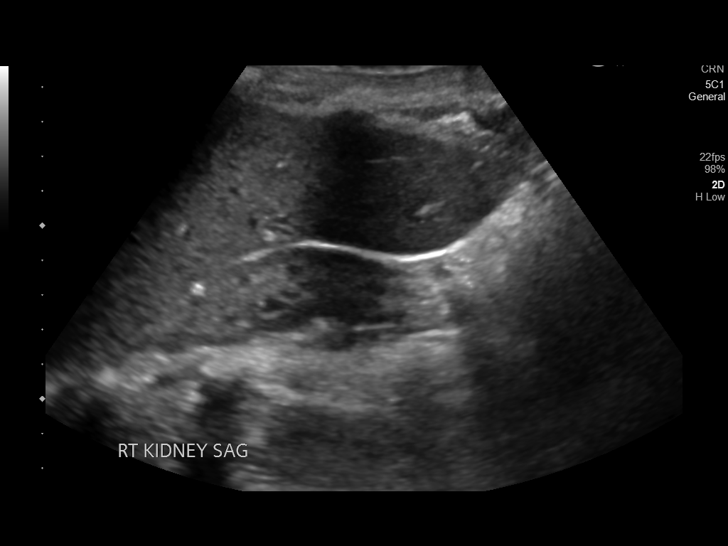
[im 84/112]
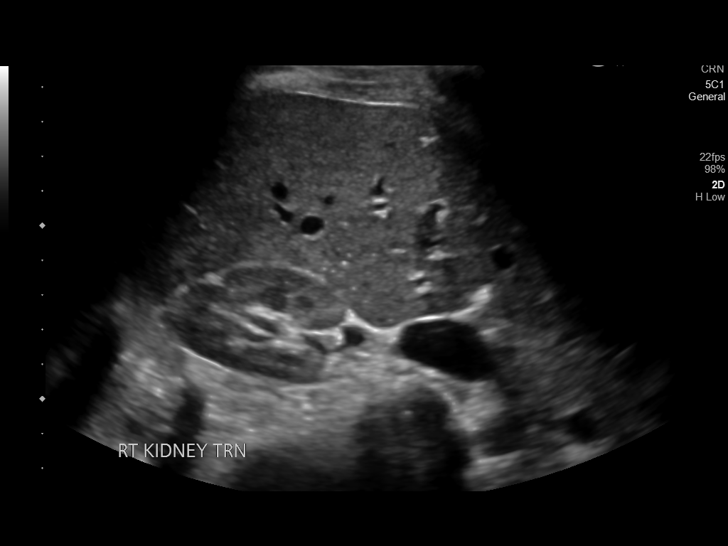
[im 93/112]
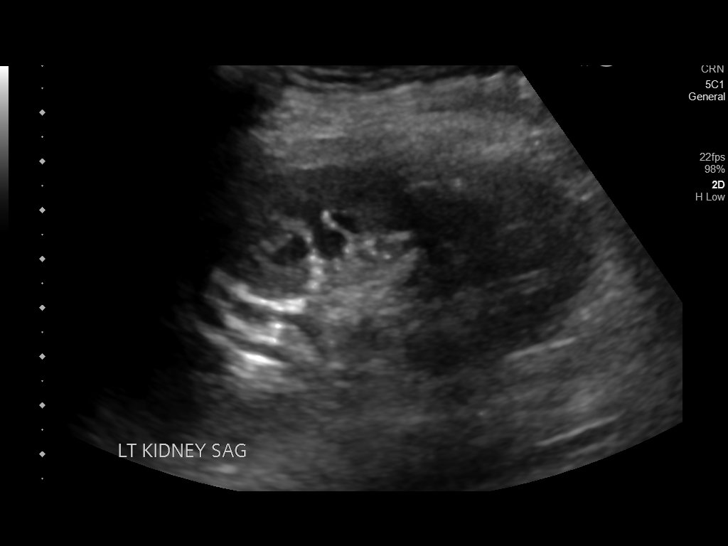
[im 102/112]
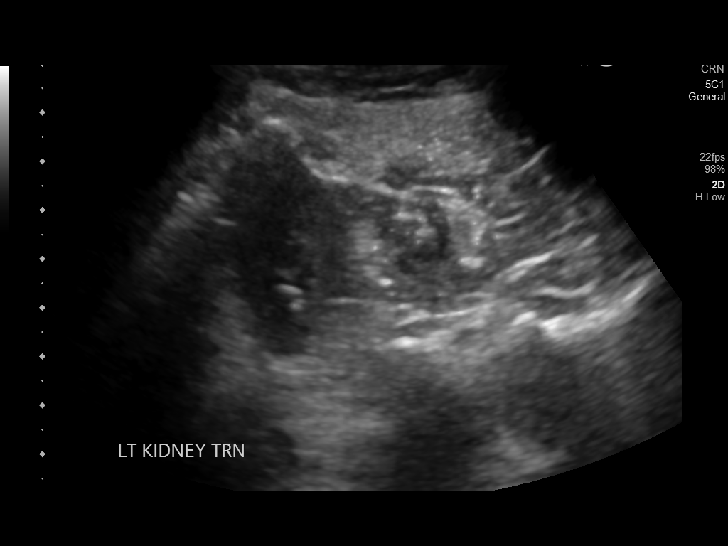
[im 112/112]
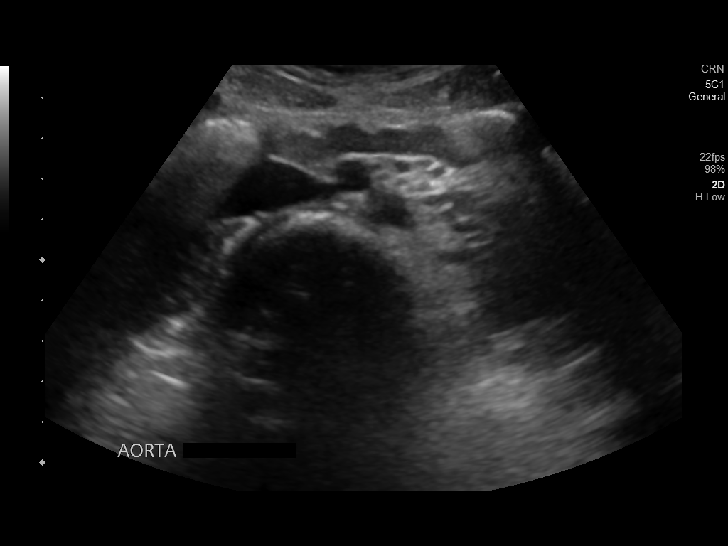

[14 of 25 positions shown; findings below may reference images not displayed]

FINDINGS: Gallbladder: No gallstones or wall thickening visualized. There is
no pericholecystic fluid. No sonographic Murphy sign noted by
sonographer.

Common bile duct: Diameter: 2 mm. No intrahepatic, common hepatic,
common bile duct dilatation.

Liver: No focal lesion identified. Within normal limits in
parenchymal echogenicity. Portal vein is patent on color Doppler
imaging with normal direction of blood flow towards the liver.

IVC: No abnormality visualized.

Pancreas: No pancreatic mass or inflammatory focus.

Spleen: Size and appearance within normal limits. Small accessory
spleen noted medially.

Right Kidney: Length: 10.5 cm. Echogenicity within normal limits. No
mass or hydronephrosis visualized.

Left Kidney: Length: 10.0 cm. Echogenicity within normal limits. No
mass or hydronephrosis visualized.

Abdominal aorta: No aneurysm visualized.

Other findings: No demonstrable ascites.
IMPRESSION: Study within normal limits.

## 2020-05-19 ENCOUNTER — Encounter: Payer: Self-pay | Admitting: Internal Medicine

## 2020-05-19 ENCOUNTER — Ambulatory Visit: Payer: 59 | Admitting: Internal Medicine

## 2020-05-19 ENCOUNTER — Inpatient Hospital Stay (HOSPITAL_BASED_OUTPATIENT_CLINIC_OR_DEPARTMENT_OTHER): Payer: 59 | Admitting: Internal Medicine

## 2020-05-19 ENCOUNTER — Inpatient Hospital Stay: Payer: 59 | Attending: Internal Medicine

## 2020-05-19 ENCOUNTER — Other Ambulatory Visit: Payer: Self-pay

## 2020-05-19 ENCOUNTER — Other Ambulatory Visit: Payer: 59

## 2020-05-19 VITALS — BP 135/78 | HR 70 | Temp 98.0°F | Resp 16 | Ht 62.0 in | Wt 124.4 lb

## 2020-05-19 DIAGNOSIS — D473 Essential (hemorrhagic) thrombocythemia: Secondary | ICD-10-CM | POA: Diagnosis present

## 2020-05-19 LAB — CBC WITH DIFFERENTIAL (CANCER CENTER ONLY)
Abs Immature Granulocytes: 0.01 10*3/uL (ref 0.00–0.07)
Basophils Absolute: 0 10*3/uL (ref 0.0–0.1)
Basophils Relative: 1 %
Eosinophils Absolute: 0 10*3/uL (ref 0.0–0.5)
Eosinophils Relative: 1 %
HCT: 39.4 % (ref 36.0–46.0)
Hemoglobin: 13.3 g/dL (ref 12.0–15.0)
Immature Granulocytes: 0 %
Lymphocytes Relative: 28 %
Lymphs Abs: 1.8 10*3/uL (ref 0.7–4.0)
MCH: 34.1 pg — ABNORMAL HIGH (ref 26.0–34.0)
MCHC: 33.8 g/dL (ref 30.0–36.0)
MCV: 101 fL — ABNORMAL HIGH (ref 80.0–100.0)
Monocytes Absolute: 0.5 10*3/uL (ref 0.1–1.0)
Monocytes Relative: 9 %
Neutro Abs: 4 10*3/uL (ref 1.7–7.7)
Neutrophils Relative %: 61 %
Platelet Count: 253 10*3/uL (ref 150–400)
RBC: 3.9 MIL/uL (ref 3.87–5.11)
RDW: 12.3 % (ref 11.5–15.5)
WBC Count: 6.4 10*3/uL (ref 4.0–10.5)
nRBC: 0 % (ref 0.0–0.2)

## 2020-05-19 LAB — CMP (CANCER CENTER ONLY)
ALT: 27 U/L (ref 0–44)
AST: 24 U/L (ref 15–41)
Albumin: 4 g/dL (ref 3.5–5.0)
Alkaline Phosphatase: 52 U/L (ref 38–126)
Anion gap: 4 — ABNORMAL LOW (ref 5–15)
BUN: 9 mg/dL (ref 6–20)
CO2: 31 mmol/L (ref 22–32)
Calcium: 9.6 mg/dL (ref 8.9–10.3)
Chloride: 104 mmol/L (ref 98–111)
Creatinine: 1.03 mg/dL — ABNORMAL HIGH (ref 0.44–1.00)
GFR, Estimated: >60 mL/min (ref >60)
Glucose, Bld: 64 mg/dL — ABNORMAL LOW (ref 70–99)
Potassium: 3.7 mmol/L (ref 3.5–5.1)
Sodium: 139 mmol/L (ref 135–145)
Total Bilirubin: 1 mg/dL (ref 0.3–1.2)
Total Protein: 8 g/dL (ref 6.5–8.1)

## 2020-05-19 LAB — LACTATE DEHYDROGENASE: LDH: 220 U/L — ABNORMAL HIGH (ref 98–192)

## 2020-05-19 MED ORDER — HYDROXYUREA 500 MG PO CAPS: 500.0000 mg | ORAL_CAPSULE | Freq: Every day | ORAL | 1 refills | Status: DC

## 2020-05-19 NOTE — Progress Notes (Signed)
[facility] Telephone:(336) [phone]   Fax:(336) [place], MD [address] [place] [address]  DIAGNOSIS:Essential thrombocythemia diagnosed in May 2019. Positive for JAK2 mutation.  PRIOR THERAPY:None  CURRENT THERAPY:Hydroxyurea 500 mg daily. The first dose was started on 01/01/2018.  INTERVAL HISTORY: Darlene Davis 44 y.o. female returns to the clinic today for follow-up visit.  The patient is feeling fine today with no concerning complaints.  She eats greater than exercise at regular basis.  She denied having any chest pain, shortness of breath, cough or hemoptysis.  She denied having any fever or chills.  She has no nausea, vomiting, diarrhea or constipation.  She has no headache or visual changes.  She continues to tolerate her treatment with hydroxyurea fairly well.  The patient is here today for evaluation and repeat blood work.   MEDICAL HISTORY: Past Medical History:  Diagnosis Date  . Essential thrombocythemia ([place]) 01/01/2018  . 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  MEDICATIONS:  Current Outpatient Medications  Medication Sig Dispense Refill  . acyclovir (ZOVIRAX) 400 MG tablet     . Ascorbic Acid (VITAMIN C PO) Take by mouth.    . Cholecalciferol (VITAMIN D PO) Take by mouth.    . folic acid (FOLATE) 326 MCG tablet Take 400 mcg by mouth 3 (three) times a week.    . hydroxyurea (HYDREA) 500 MG capsule TAKE 1 CAPSULE BY MOUTH  DAILY 90 capsule 1  . Multiple Vitamins-Iron (MULTIVITAMINS WITH IRON) TABS tablet Take 1 tablet by mouth daily.    . prochlorperazine (COMPAZINE) 10 MG tablet Take 1 tablet (10 mg total) by mouth every 6 (six) hours as needed for nausea or vomiting. 30 tablet 0  . SPRINTEC 28 0.25-35 MG-MCG tablet Take 1 tablet by mouth daily.     No current facility-administered medications for this visit.    SURGICAL  HISTORY:  Past Surgical History:  Procedure Laterality Date  . NO PAST SURGERIES      REVIEW OF SYSTEMS:  A comprehensive review of systems was negative.   PHYSICAL EXAMINATION: General appearance: alert, cooperative and no distress Head: Normocephalic, without obvious abnormality, atraumatic Neck: no adenopathy, no JVD, supple, symmetrical, trachea midline and thyroid not enlarged, symmetric, no tenderness/mass/nodules Lymph nodes: Cervical, supraclavicular, and axillary nodes normal. Resp: clear to auscultation bilaterally Back: symmetric, no curvature. ROM normal. No CVA tenderness. Cardio: regular rate and rhythm, S1, S2 normal, no murmur, click, rub or gallop GI: soft, non-tender; bowel sounds normal; no masses,  no organomegaly Extremities: extremities normal, atraumatic, no cyanosis or edema  ECOG PERFORMANCE STATUS: 0 - Asymptomatic  Blood pressure 135/78, pulse 70, temperature 98 F (36.7 C), temperature source Tympanic, resp. rate 16, height 5\' 2"  (1.575 m), weight 124 lb 6.4 oz (56.4 kg), SpO2 100 %.  LABORATORY DATA: Lab Results  Component Value Date   WBC 6.4 05/19/2020   HGB 13.3 05/19/2020   HCT 39.4 05/19/2020   MCV 101.0 (H) 05/19/2020   PLT 253 05/19/2020      Chemistry      Component Value Date/Time   NA 143 02/18/2020 0800   K 4.0 02/18/2020 0800   CL 104 02/18/2020 0800   CO2 29 02/18/2020 0800   BUN 8 02/18/2020 0800   CREATININE 0.96 02/18/2020 0800      Component Value Date/Time  CALCIUM 9.5 02/18/2020 0800   ALKPHOS 52 02/18/2020 0800   AST 25 02/18/2020 0800   ALT 19 02/18/2020 0800   BILITOT 1.1 02/18/2020 0800       RADIOGRAPHIC STUDIES: No results found.  ASSESSMENT AND PLAN: This is a very pleasant 44 years old African-American female with essential thrombocythemia and currently on treatment with hydroxyurea 500 mg p.o. daily.   The patient continues to tolerate her treatment with hydroxyurea fairly well. Her CBC today showed  normal total white blood count, normal hemoglobin and hematocrit as well as platelets count.  Comprehensive metabolic panel is still pending. I recommended for the patient to continue her current treatment with hydroxyurea. I will see her back for follow-up visit in 3 months for evaluation and repeat blood work. She was advised to call immediately if she has any concerning symptoms in the interval. The patient voices understanding of current disease status and treatment options and is in agreement with the current care plan. All questions were answered. The patient knows to call the clinic with any problems, questions or concerns. We can certainly see the patient much sooner if necessary.  Disclaimer: This note was dictated with voice recognition software. Similar sounding words can inadvertently be transcribed and may not be corrected upon review.

## 2020-05-20 ENCOUNTER — Telehealth: Payer: Self-pay | Admitting: Internal Medicine

## 2020-05-20 NOTE — Telephone Encounter (Signed)
Scheduled per los. Called, not able to leave msg. Mailed printout  

## 2020-08-18 ENCOUNTER — Other Ambulatory Visit: Payer: Self-pay

## 2020-08-18 ENCOUNTER — Inpatient Hospital Stay: Payer: 59

## 2020-08-18 ENCOUNTER — Inpatient Hospital Stay: Payer: 59 | Attending: Internal Medicine | Admitting: Internal Medicine

## 2020-08-18 VITALS — BP 125/77 | HR 90 | Temp 97.0°F | Resp 18 | Ht 62.0 in | Wt 123.9 lb

## 2020-08-18 DIAGNOSIS — D473 Essential (hemorrhagic) thrombocythemia: Secondary | ICD-10-CM

## 2020-08-18 LAB — CBC WITH DIFFERENTIAL (CANCER CENTER ONLY)
Abs Immature Granulocytes: 0.01 10*3/uL (ref 0.00–0.07)
Basophils Absolute: 0 10*3/uL (ref 0.0–0.1)
Basophils Relative: 1 %
Eosinophils Absolute: 0.1 10*3/uL (ref 0.0–0.5)
Eosinophils Relative: 1 %
HCT: 43.4 % (ref 36.0–46.0)
Hemoglobin: 14.5 g/dL (ref 12.0–15.0)
Immature Granulocytes: 0 %
Lymphocytes Relative: 40 %
Lymphs Abs: 2.1 10*3/uL (ref 0.7–4.0)
MCH: 33 pg (ref 26.0–34.0)
MCHC: 33.4 g/dL (ref 30.0–36.0)
MCV: 98.9 fL (ref 80.0–100.0)
Monocytes Absolute: 0.6 10*3/uL (ref 0.1–1.0)
Monocytes Relative: 11 %
Neutro Abs: 2.4 10*3/uL (ref 1.7–7.7)
Neutrophils Relative %: 47 %
Platelet Count: 247 10*3/uL (ref 150–400)
RBC: 4.39 MIL/uL (ref 3.87–5.11)
RDW: 11.7 % (ref 11.5–15.5)
WBC Count: 5.2 10*3/uL (ref 4.0–10.5)
nRBC: 0 % (ref 0.0–0.2)

## 2020-08-18 LAB — CMP (CANCER CENTER ONLY)
ALT: 21 U/L (ref 0–44)
AST: 18 U/L (ref 15–41)
Albumin: 4.2 g/dL (ref 3.5–5.0)
Alkaline Phosphatase: 55 U/L (ref 38–126)
Anion gap: 5 (ref 5–15)
BUN: 7 mg/dL (ref 6–20)
CO2: 33 mmol/L — ABNORMAL HIGH (ref 22–32)
Calcium: 9.7 mg/dL (ref 8.9–10.3)
Chloride: 102 mmol/L (ref 98–111)
Creatinine: 1.07 mg/dL — ABNORMAL HIGH (ref 0.44–1.00)
GFR, Estimated: >60 mL/min (ref >60)
Glucose, Bld: 60 mg/dL — ABNORMAL LOW (ref 70–99)
Potassium: 4.1 mmol/L (ref 3.5–5.1)
Sodium: 140 mmol/L (ref 135–145)
Total Bilirubin: 1.3 mg/dL — ABNORMAL HIGH (ref 0.3–1.2)
Total Protein: 8 g/dL (ref 6.5–8.1)

## 2020-08-18 LAB — LACTATE DEHYDROGENASE: LDH: 169 U/L (ref 98–192)

## 2020-08-18 NOTE — Progress Notes (Signed)
[facility] Telephone:([phone]   Fax:(336) [place], MD [address] [place] [address]  DIAGNOSIS:Essential thrombocythemia diagnosed in May 2019. Positive for JAK2 mutation.  PRIOR THERAPY:None  CURRENT THERAPY:Hydroxyurea 500 mg daily. The first dose was started on 01/01/2018.  INTERVAL HISTORY: Darlene Davis 45 y.o. female returns to the clinic today for 72-month follow-up visit.  The patient is feeling fine today with no concerning complaints.  She denied having any current chest pain, shortness of breath, cough or hemoptysis.  She has no recent weight loss or night sweats.  She has no bleeding issues she has no nausea, vomiting, diarrhea or constipation.  She continues to tolerate her treatment with hydroxyurea fairly well.  She is here today for evaluation and repeat blood work for close monitoring of her disease.   MEDICAL HISTORY: Past Medical History:  Diagnosis Date  . Essential thrombocythemia ([place]) 01/01/2018  . 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  MEDICATIONS:  Current Outpatient Medications  Medication Sig Dispense Refill  . acyclovir (ZOVIRAX) 400 MG tablet     . Ascorbic Acid (VITAMIN C PO) Take by mouth.    . Cholecalciferol (VITAMIN D PO) Take by mouth.    . folic acid (FOLATE) 295 MCG tablet Take 400 mcg by mouth 3 (three) times a week.    . hydroxyurea (HYDREA) 500 MG capsule Take 1 capsule (500 mg total) by mouth daily. May take with food to minimize GI side effects. 90 capsule 1  . Multiple Vitamins-Iron (MULTIVITAMINS WITH IRON) TABS tablet Take 1 tablet by mouth daily.    . prochlorperazine (COMPAZINE) 10 MG tablet Take 1 tablet (10 mg total) by mouth every 6 (six) hours as needed for nausea or vomiting. 30 tablet 0  . SPRINTEC 28 0.25-35 MG-MCG tablet Take 1 tablet by mouth daily.     No current  facility-administered medications for this visit.    SURGICAL HISTORY:  Past Surgical History:  Procedure Laterality Date  . NO PAST SURGERIES      REVIEW OF SYSTEMS:  A comprehensive review of systems was negative.   PHYSICAL EXAMINATION: General appearance: alert, cooperative and no distress Head: Normocephalic, without obvious abnormality, atraumatic Neck: no adenopathy, no JVD, supple, symmetrical, trachea midline and thyroid not enlarged, symmetric, no tenderness/mass/nodules Lymph nodes: Cervical, supraclavicular, and axillary nodes normal. Resp: clear to auscultation bilaterally Back: symmetric, no curvature. ROM normal. No CVA tenderness. Cardio: regular rate and rhythm, S1, S2 normal, no murmur, click, rub or gallop GI: soft, non-tender; bowel sounds normal; no masses,  no organomegaly Extremities: extremities normal, atraumatic, no cyanosis or edema  ECOG PERFORMANCE STATUS: 0 - Asymptomatic  Blood pressure 125/77, pulse 90, temperature (!) 97 F (36.1 C), temperature source Tympanic, resp. rate 18, height 5\' 2"  (1.575 m), weight 123 lb 14.4 oz (56.2 kg), SpO2 100 %.  LABORATORY DATA: Lab Results  Component Value Date   WBC 5.2 08/18/2020   HGB 14.5 08/18/2020   HCT 43.4 08/18/2020   MCV 98.9 08/18/2020   PLT 247 08/18/2020      Chemistry      Component Value Date/Time   NA 139 05/19/2020 0950   K 3.7 05/19/2020 0950   CL 104 05/19/2020 0950   CO2 31 05/19/2020 0950   BUN 9 05/19/2020 0950   CREATININE 1.03 (H) 05/19/2020  0950      Component Value Date/Time   CALCIUM 9.6 05/19/2020 0950   ALKPHOS 52 05/19/2020 0950   AST 24 05/19/2020 0950   ALT 27 05/19/2020 0950   BILITOT 1.0 05/19/2020 0950       RADIOGRAPHIC STUDIES: No results found.  ASSESSMENT AND PLAN: This is a very pleasant 45 years old African-American female with essential thrombocythemia and currently on treatment with hydroxyurea 500 mg p.o. daily.   The patient has been tolerating  her treatment with hydroxyurea fairly well. Repeat CBC today is unremarkable. I recommended for her to continue her current treatment with hydroxyurea 500 mg p.o. daily.  She was also advised to continue on the folic acid tablet few times a week.   The patient will come back for follow-up visit in 3 months for evaluation and repeat blood work. She was advised to call immediately if she has any concerning symptoms in the interval.  The patient voices understanding of current disease status and treatment options and is in agreement with the current care plan. All questions were answered. The patient knows to call the clinic with any problems, questions or concerns. We can certainly see the patient much sooner if necessary.  Disclaimer: This note was dictated with voice recognition software. Similar sounding words can inadvertently be transcribed and may not be corrected upon review.

## 2020-09-07 ENCOUNTER — Telehealth: Payer: Self-pay | Admitting: Internal Medicine

## 2020-09-07 LAB — HM PAP SMEAR

## 2020-09-07 LAB — HIV ANTIBODY (ROUTINE TESTING W REFLEX)

## 2020-09-07 LAB — HM HEPATITIS C SCREENING LAB: HM Hepatitis Screen: NEGATIVE

## 2020-09-07 NOTE — Telephone Encounter (Signed)
Left message with upcoming appointment. Gave option to call back if needed.

## 2020-10-03 ENCOUNTER — Telehealth: Payer: Self-pay | Admitting: Internal Medicine

## 2020-10-03 NOTE — Telephone Encounter (Signed)
R/s appointments per 2/28 call from patient. Patient is aware of updated appointments date and times.

## 2020-10-31 ENCOUNTER — Encounter: Payer: Self-pay | Admitting: Internal Medicine

## 2020-11-16 ENCOUNTER — Other Ambulatory Visit: Payer: 59

## 2020-11-16 ENCOUNTER — Ambulatory Visit: Payer: 59 | Admitting: Internal Medicine

## 2020-11-17 ENCOUNTER — Other Ambulatory Visit: Payer: Self-pay

## 2020-11-17 ENCOUNTER — Inpatient Hospital Stay: Payer: 59 | Attending: Internal Medicine

## 2020-11-17 ENCOUNTER — Inpatient Hospital Stay (HOSPITAL_BASED_OUTPATIENT_CLINIC_OR_DEPARTMENT_OTHER): Payer: 59 | Admitting: Internal Medicine

## 2020-11-17 VITALS — BP 116/75 | HR 74 | Temp 98.2°F | Resp 18 | Ht 62.0 in | Wt 128.0 lb

## 2020-11-17 DIAGNOSIS — D473 Essential (hemorrhagic) thrombocythemia: Secondary | ICD-10-CM

## 2020-11-17 DIAGNOSIS — Z79899 Other long term (current) drug therapy: Secondary | ICD-10-CM | POA: Insufficient documentation

## 2020-11-17 DIAGNOSIS — B009 Herpesviral infection, unspecified: Secondary | ICD-10-CM | POA: Insufficient documentation

## 2020-11-17 LAB — CBC WITH DIFFERENTIAL (CANCER CENTER ONLY)
Abs Immature Granulocytes: 0.01 10*3/uL (ref 0.00–0.07)
Basophils Absolute: 0 10*3/uL (ref 0.0–0.1)
Basophils Relative: 0 %
Eosinophils Absolute: 0.1 10*3/uL (ref 0.0–0.5)
Eosinophils Relative: 1 %
HCT: 38.2 % (ref 36.0–46.0)
Hemoglobin: 12.9 g/dL (ref 12.0–15.0)
Immature Granulocytes: 0 %
Lymphocytes Relative: 25 %
Lymphs Abs: 2.1 10*3/uL (ref 0.7–4.0)
MCH: 33.3 pg (ref 26.0–34.0)
MCHC: 33.8 g/dL (ref 30.0–36.0)
MCV: 98.7 fL (ref 80.0–100.0)
Monocytes Absolute: 0.7 10*3/uL (ref 0.1–1.0)
Monocytes Relative: 9 %
Neutro Abs: 5.5 10*3/uL (ref 1.7–7.7)
Neutrophils Relative %: 65 %
Platelet Count: 284 10*3/uL (ref 150–400)
RBC: 3.87 MIL/uL (ref 3.87–5.11)
RDW: 11.9 % (ref 11.5–15.5)
WBC Count: 8.4 10*3/uL (ref 4.0–10.5)
nRBC: 0 % (ref 0.0–0.2)

## 2020-11-17 LAB — CMP (CANCER CENTER ONLY)
ALT: 17 U/L (ref 0–44)
AST: 18 U/L (ref 15–41)
Albumin: 3.7 g/dL (ref 3.5–5.0)
Alkaline Phosphatase: 50 U/L (ref 38–126)
Anion gap: 10 (ref 5–15)
BUN: 10 mg/dL (ref 6–20)
CO2: 27 mmol/L (ref 22–32)
Calcium: 9.1 mg/dL (ref 8.9–10.3)
Chloride: 103 mmol/L (ref 98–111)
Creatinine: 1.02 mg/dL — ABNORMAL HIGH (ref 0.44–1.00)
GFR, Estimated: >60 mL/min (ref >60)
Glucose, Bld: 93 mg/dL (ref 70–99)
Potassium: 5.2 mmol/L — ABNORMAL HIGH (ref 3.5–5.1)
Sodium: 140 mmol/L (ref 135–145)
Total Bilirubin: 1.1 mg/dL (ref 0.3–1.2)
Total Protein: 7.1 g/dL (ref 6.5–8.1)

## 2020-11-17 LAB — LACTATE DEHYDROGENASE: LDH: 152 U/L (ref 98–192)

## 2020-11-17 NOTE — Progress Notes (Signed)
[facility] Telephone:([phone]   Fax:(336) [place], MD [address] [place] [address]  DIAGNOSIS:Essential thrombocythemia diagnosed in May 2019. Positive for JAK2 mutation.  PRIOR THERAPY:None  CURRENT THERAPY:Hydroxyurea 500 mg daily. The first dose was started on 01/01/2018.  INTERVAL HISTORY: Darlene Davis 45 y.o. female returns to the clinic today for follow-up visit.  The patient is feeling fine today with no concerning complaints.  She denied having any current chest pain, shortness of breath, cough or hemoptysis.  She denied having any fever or chills.  She has no nausea, vomiting, diarrhea or constipation.  She has no headache or visual changes.  She denied having any weight loss or night sweats.  She continues to tolerate her treatment with hydroxyurea fairly well.  She also use folic acid 536 mcg p.o. 3 times a week.  She is here today for evaluation and repeat blood work.  MEDICAL HISTORY: Past Medical History:  Diagnosis Date  . Essential thrombocythemia ([place]) 01/01/2018  . 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  MEDICATIONS:  Current Outpatient Medications  Medication Sig Dispense Refill  . Ascorbic Acid (VITAMIN C PO) Take by mouth.    . Cholecalciferol (VITAMIN D PO) Take by mouth.    . folic acid (FOLATE) 144 MCG tablet Take 400 mcg by mouth 3 (three) times a week.    . hydroxyurea (HYDREA) 500 MG capsule Take 1 capsule (500 mg total) by mouth daily. May take with food to minimize GI side effects. 90 capsule 1  . Multiple Vitamins-Iron (MULTIVITAMINS WITH IRON) TABS tablet Take 1 tablet by mouth daily.    . prochlorperazine (COMPAZINE) 10 MG tablet Take 1 tablet (10 mg total) by mouth every 6 (six) hours as needed for nausea or vomiting. 30 tablet 0  . SPRINTEC 28 0.25-35 MG-MCG tablet Take 1 tablet by mouth daily.    .  valACYclovir (VALTREX) 1000 MG tablet Take by mouth.     No current facility-administered medications for this visit.    SURGICAL HISTORY:  Past Surgical History:  Procedure Laterality Date  . NO PAST SURGERIES      REVIEW OF SYSTEMS:  A comprehensive review of systems was negative.   PHYSICAL EXAMINATION: General appearance: alert, cooperative and no distress Head: Normocephalic, without obvious abnormality, atraumatic Neck: no adenopathy, no JVD, supple, symmetrical, trachea midline and thyroid not enlarged, symmetric, no tenderness/mass/nodules Lymph nodes: Cervical, supraclavicular, and axillary nodes normal. Resp: clear to auscultation bilaterally Back: symmetric, no curvature. ROM normal. No CVA tenderness. Cardio: regular rate and rhythm, S1, S2 normal, no murmur, click, rub or gallop GI: soft, non-tender; bowel sounds normal; no masses,  no organomegaly Extremities: extremities normal, atraumatic, no cyanosis or edema  ECOG PERFORMANCE STATUS: 0 - Asymptomatic  Blood pressure 116/75, pulse 74, temperature 98.2 F (36.8 C), temperature source Tympanic, resp. rate 18, height 5\' 2"  (1.575 m), weight 128 lb (58.1 kg), SpO2 100 %.  LABORATORY DATA: Lab Results  Component Value Date   WBC 8.4 11/17/2020   HGB 12.9 11/17/2020   HCT 38.2 11/17/2020   MCV 98.7 11/17/2020   PLT 284 11/17/2020      Chemistry      Component Value Date/Time   NA 140 11/17/2020 0952   K 5.2 (H) 11/17/2020 0952   CL 103 11/17/2020 3154  CO2 27 11/17/2020 0952   BUN 10 11/17/2020 0952   CREATININE 1.02 (H) 11/17/2020 0952      Component Value Date/Time   CALCIUM 9.1 11/17/2020 0952   ALKPHOS 50 11/17/2020 0952   AST 18 11/17/2020 0952   ALT 17 11/17/2020 0952   BILITOT 1.1 11/17/2020 0952       RADIOGRAPHIC STUDIES: No results found.  ASSESSMENT AND PLAN: This is a very pleasant 45 years old African-American female with essential thrombocythemia and currently on treatment with  hydroxyurea 500 mg p.o. daily.  She is also on folic acid and 748 mcg p.o. 3 times a week. The patient continues to tolerate her treatment with hydroxyurea fairly well. Repeat CBC today showed no significant abnormalities. Comprehensive metabolic panel showed mild hyperkalemia but the patient just had banana smoothie earlier today before her blood work which may explain the mild hyperkalemia. I recommended for the patient to continue her current treatment with hydroxyurea and folic acid. I will see her back for follow-up visit in 3 months for evaluation with repeat CBC and comprehensive metabolic panel. She was advised to call immediately if she has any concerning symptoms in the interval. The patient voices understanding of current disease status and treatment options and is in agreement with the current care plan. All questions were answered. The patient knows to call the clinic with any problems, questions or concerns. We can certainly see the patient much sooner if necessary.  Disclaimer: This note was dictated with voice recognition software. Similar sounding words can inadvertently be transcribed and may not be corrected upon review.

## 2020-11-28 ENCOUNTER — Other Ambulatory Visit: Payer: Self-pay | Admitting: Physician Assistant

## 2020-11-28 ENCOUNTER — Other Ambulatory Visit: Payer: Self-pay

## 2020-11-28 DIAGNOSIS — D473 Essential (hemorrhagic) thrombocythemia: Secondary | ICD-10-CM

## 2020-11-28 MED ORDER — HYDROXYUREA 500 MG PO CAPS: 500.0000 mg | ORAL_CAPSULE | Freq: Every day | ORAL | 1 refills | Status: DC

## 2020-12-10 LAB — HM MAMMOGRAPHY

## 2020-12-13 ENCOUNTER — Encounter: Payer: Self-pay | Admitting: Internal Medicine

## 2021-01-23 ENCOUNTER — Encounter: Payer: 59 | Admitting: Internal Medicine

## 2021-01-25 ENCOUNTER — Other Ambulatory Visit: Payer: Self-pay

## 2021-01-25 ENCOUNTER — Ambulatory Visit (INDEPENDENT_AMBULATORY_CARE_PROVIDER_SITE_OTHER): Payer: 59 | Admitting: Internal Medicine

## 2021-01-25 ENCOUNTER — Encounter: Payer: Self-pay | Admitting: Internal Medicine

## 2021-01-25 VITALS — BP 116/70 | HR 74 | Temp 98.2°F | Resp 16 | Ht 62.0 in | Wt 126.0 lb

## 2021-01-25 DIAGNOSIS — Z0000 Encounter for general adult medical examination without abnormal findings: Secondary | ICD-10-CM | POA: Diagnosis not present

## 2021-01-25 NOTE — Progress Notes (Signed)
Subjective:    Patient ID: Darlene Davis, female    DOB: [birth year], 45 y.o.   MRN: [id number]  DOS:  01/25/2021 Type of visit - description: CPX  Here for CPX, has no major concerns.  Feeling well.  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    Past Surgical History:  Procedure Laterality Date   NO PAST SURGERIES     Social History   Socioeconomic History   Marital status: Single    Spouse name: Not on file   Number of children: 0   Years of education: Not on file   Highest education level: Not on file  Occupational History   Occupation: Chief of Staff, [employer]  Tobacco Use   Smoking status: Never   Smokeless tobacco: Never   Tobacco comments:       Vaping Use   Vaping Use: Never used  Substance and Sexual Activity   Alcohol use: Not Currently    Comment: rarely    Drug use: Never   Sexual activity: Not on file  Other Topics Concern   Not on file  Social History Narrative   Lived by herself   Social Determinants of Health   Financial Resource Strain: Not on file  Food Insecurity: Not on file  Transportation Needs: Not on file  Physical Activity: Not on file  Stress: Not on file  Social Connections: Not on file  Intimate Partner Violence: Not on file     Allergies as of 01/25/2021   No Known Allergies      Medication List        Accurate as of January 25, 2021 11:59 PM. If you have any questions, ask your nurse or doctor.          folic acid 409 MCG tablet Commonly known as: FOLVITE Take 400 mcg by mouth 3 (three) times a week.   hydroxyurea 500 MG capsule Commonly known as: HYDREA Take 1 capsule (500 mg total) by mouth daily. May take with food to minimize GI side effects.   multivitamins with iron Tabs tablet Take 1 tablet by mouth  daily.   prochlorperazine 10 MG tablet Commonly known as: COMPAZINE Take 1 tablet (10 mg total) by mouth every 6 (six) hours as needed for nausea or vomiting.   Sprintec 28 0.25-35 MG-MCG tablet Generic drug: norgestimate-ethinyl estradiol Take 1 tablet by mouth daily.   valACYclovir 1000 MG tablet Commonly known as: VALTREX Take by mouth.   VITAMIN C PO Take by mouth.   VITAMIN D PO Take by mouth.           Objective:   Physical Exam BP 116/70 (BP Location: Left Arm, Patient Position: Sitting, Cuff Size: Small)   Pulse 74   Temp 98.2 F (36.8 C) (Oral)   Resp 16   Ht 5\' 2"  (1.575 m)   Wt 126 lb (57.2 kg)   LMP 01/11/2021 (Approximate)   SpO2 98%   BMI 23.05 kg/m  General: Well developed, NAD, BMI noted Neck: No  thyromegaly  HEENT:  Normocephalic . Face symmetric, atraumatic Lungs:  CTA B Normal respiratory effort, no intercostal retractions, no accessory muscle use. Heart: RRR,  no murmur.  Abdomen:  Not distended, soft, non-tender. No rebound or rigidity.   Lower  extremities: no pretibial edema bilaterally  Skin: Exposed areas without rash. Not pale. Not jaundice Neurologic:  alert & oriented X3.  Speech normal, gait appropriate for age and unassisted Strength symmetric and appropriate for age.  Psych: Cognition and judgment appear intact.  Cooperative with normal attention span and concentration.  Behavior appropriate. No anxious or depressed appearing.     Assessment     Assessment Essential thrombocythemia, sees hem  PLAN Here for CPX Thrombocythemia: Follow-up by hematology RTC 1 year    This visit occurred during the SARS-CoV-2 public health emergency.  Safety protocols were in place, including screening questions prior to the visit, additional usage of staff PPE, and extensive cleaning of exam room while observing appropriate contact time as indicated for disinfecting solutions.

## 2021-01-25 NOTE — Patient Instructions (Signed)
    GO TO THE LAB : Get the blood work     [place], [place] [facility] back for   a physical in 1 year

## 2021-01-26 ENCOUNTER — Telehealth: Payer: Self-pay | Admitting: Internal Medicine

## 2021-01-26 ENCOUNTER — Encounter: Payer: Self-pay | Admitting: Internal Medicine

## 2021-01-26 DIAGNOSIS — Z1211 Encounter for screening for malignant neoplasm of colon: Secondary | ICD-10-CM

## 2021-01-26 LAB — BASIC METABOLIC PANEL
BUN: 8 mg/dL (ref 6–23)
CO2: 28 mEq/L (ref 19–32)
Calcium: 9.5 mg/dL (ref 8.4–10.5)
Chloride: 99 mEq/L (ref 96–112)
Creatinine, Ser: 1.06 mg/dL (ref 0.40–1.20)
GFR: 62.64 mL/min (ref >60.00)
Glucose, Bld: 81 mg/dL (ref 70–99)
Potassium: 3.9 mEq/L (ref 3.5–5.1)
Sodium: 136 mEq/L (ref 135–145)

## 2021-01-26 LAB — LIPID PANEL
Cholesterol: 193 mg/dL (ref 0–200)
HDL: 74.2 mg/dL (ref >39.00)
LDL Cholesterol: 101 mg/dL — ABNORMAL HIGH (ref 0–99)
NonHDL: 118.3
Total CHOL/HDL Ratio: 3
Triglycerides: 86 mg/dL (ref 0.0–149.0)
VLDL: 17.2 mg/dL (ref 0.0–40.0)

## 2021-01-26 LAB — TSH: TSH: 1.03 u[IU]/mL (ref 0.35–4.50)

## 2021-01-26 NOTE — Telephone Encounter (Signed)
Referral placed.

## 2021-01-26 NOTE — Assessment & Plan Note (Signed)
Td: 2021  S/p Pfizer covid x 3, #4 optional  Female Care: per gyn  MMG 12/2020 CCS: Three options extensively discussed, she took an iFOB kit and will call if she prefers to proceed with a Cologuard or colonoscopy Labs reviewed, will get a BMP, FLP, TSH Diet exercise, she is doing very well. RTC 1 year

## 2021-01-26 NOTE — Telephone Encounter (Signed)
The patient states her insurance will pay for a colonoscopy. You may refer her, according to the patient.

## 2021-02-01 ENCOUNTER — Encounter: Payer: 59 | Admitting: Internal Medicine

## 2021-02-10 ENCOUNTER — Telehealth: Payer: Self-pay | Admitting: Internal Medicine

## 2021-02-10 NOTE — Telephone Encounter (Signed)
R/s appts per 7/8 sch msg. Pt aware.  

## 2021-02-16 ENCOUNTER — Inpatient Hospital Stay: Payer: 59

## 2021-02-16 ENCOUNTER — Inpatient Hospital Stay: Payer: 59 | Admitting: Internal Medicine

## 2021-02-20 ENCOUNTER — Inpatient Hospital Stay: Payer: 59

## 2021-02-20 ENCOUNTER — Other Ambulatory Visit: Payer: Self-pay

## 2021-02-20 ENCOUNTER — Inpatient Hospital Stay: Payer: 59 | Attending: Internal Medicine | Admitting: Internal Medicine

## 2021-02-20 VITALS — BP 118/77 | HR 75 | Temp 97.8°F | Resp 19 | Ht 62.0 in | Wt 126.6 lb

## 2021-02-20 DIAGNOSIS — D473 Essential (hemorrhagic) thrombocythemia: Secondary | ICD-10-CM

## 2021-02-20 LAB — CMP (CANCER CENTER ONLY)
ALT: 17 U/L (ref 0–44)
AST: 21 U/L (ref 15–41)
Albumin: 4 g/dL (ref 3.5–5.0)
Alkaline Phosphatase: 51 U/L (ref 38–126)
Anion gap: 8 (ref 5–15)
BUN: 13 mg/dL (ref 6–20)
CO2: 31 mmol/L (ref 22–32)
Calcium: 9.4 mg/dL (ref 8.9–10.3)
Chloride: 103 mmol/L (ref 98–111)
Creatinine: 1.08 mg/dL — ABNORMAL HIGH (ref 0.44–1.00)
GFR, Estimated: >60 mL/min (ref >60)
Glucose, Bld: 81 mg/dL (ref 70–99)
Potassium: 4.6 mmol/L (ref 3.5–5.1)
Sodium: 142 mmol/L (ref 135–145)
Total Bilirubin: 1.4 mg/dL — ABNORMAL HIGH (ref 0.3–1.2)
Total Protein: 7.5 g/dL (ref 6.5–8.1)

## 2021-02-20 LAB — CBC WITH DIFFERENTIAL (CANCER CENTER ONLY)
Abs Immature Granulocytes: 0.01 10*3/uL (ref 0.00–0.07)
Basophils Absolute: 0 10*3/uL (ref 0.0–0.1)
Basophils Relative: 1 %
Eosinophils Absolute: 0.1 10*3/uL (ref 0.0–0.5)
Eosinophils Relative: 1 %
HCT: 39.4 % (ref 36.0–46.0)
Hemoglobin: 13.4 g/dL (ref 12.0–15.0)
Immature Granulocytes: 0 %
Lymphocytes Relative: 36 %
Lymphs Abs: 2.6 10*3/uL (ref 0.7–4.0)
MCH: 32.3 pg (ref 26.0–34.0)
MCHC: 34 g/dL (ref 30.0–36.0)
MCV: 94.9 fL (ref 80.0–100.0)
Monocytes Absolute: 0.7 10*3/uL (ref 0.1–1.0)
Monocytes Relative: 10 %
Neutro Abs: 3.8 10*3/uL (ref 1.7–7.7)
Neutrophils Relative %: 52 %
Platelet Count: 251 10*3/uL (ref 150–400)
RBC: 4.15 MIL/uL (ref 3.87–5.11)
RDW: 12.1 % (ref 11.5–15.5)
WBC Count: 7.2 10*3/uL (ref 4.0–10.5)
nRBC: 0 % (ref 0.0–0.2)

## 2021-02-20 NOTE — Progress Notes (Signed)
[facility] Telephone:([phone]   Fax:(336) [place], MD [address] [place] [address]  DIAGNOSIS: Essential thrombocythemia diagnosed in May 2019.  Positive for JAK 2 mutation.   PRIOR THERAPY: None   CURRENT THERAPY: Hydroxyurea 500 mg daily.  The first dose was started on 01/01/2018.    INTERVAL HISTORY: Darlene Davis 45 y.o. female returns to the clinic today for follow-up visit.  The patient is feeling fine today with no concerning complaints.  She denied having any fatigue or weakness.  She denied having any chest pain, shortness of breath, cough or hemoptysis.  She has no nausea, vomiting, diarrhea or constipation.  She has no headache or visual changes.  She continues to tolerate her treatment with hydroxyurea and folic acid fairly well.  The patient is here today for evaluation and repeat blood work.  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  MEDICATIONS:  Current Outpatient Medications  Medication Sig Dispense Refill   Ascorbic Acid (VITAMIN C PO) Take by mouth.     Cholecalciferol (VITAMIN D PO) Take by mouth.     folic acid (FOLVITE) 425 MCG tablet Take 400 mcg by mouth 3 (three) times a week.     hydroxyurea (HYDREA) 500 MG capsule Take 1 capsule (500 mg total) by mouth daily. May take with food to minimize GI side effects. 90 capsule 1   Multiple Vitamins-Iron (MULTIVITAMINS WITH IRON) TABS tablet Take 1 tablet by mouth daily.     prochlorperazine (COMPAZINE) 10 MG tablet Take 1 tablet (10 mg total) by mouth every 6 (six) hours as needed for nausea or vomiting. 30 tablet 0   SPRINTEC 28 0.25-35 MG-MCG tablet Take 1 tablet by mouth daily.     valACYclovir (VALTREX) 1000 MG tablet Take by mouth.     No current facility-administered  medications for this visit.    SURGICAL HISTORY:  Past Surgical History:  Procedure Laterality Date   NO PAST SURGERIES      REVIEW OF SYSTEMS:  A comprehensive review of systems was negative.   PHYSICAL EXAMINATION: General appearance: alert, cooperative, and no distress Head: Normocephalic, without obvious abnormality, atraumatic Neck: no adenopathy, no JVD, supple, symmetrical, trachea midline, and thyroid not enlarged, symmetric, no tenderness/mass/nodules Lymph nodes: Cervical, supraclavicular, and axillary nodes normal. Resp: clear to auscultation bilaterally Back: symmetric, no curvature. ROM normal. No CVA tenderness. Cardio: regular rate and rhythm, S1, S2 normal, no murmur, click, rub or gallop GI: soft, non-tender; bowel sounds normal; no masses,  no organomegaly Extremities: extremities normal, atraumatic, no cyanosis or edema  ECOG PERFORMANCE STATUS: 0 - Asymptomatic  Blood pressure 118/77, pulse 75, temperature 97.8 F (36.6 C), temperature source Tympanic, resp. rate 19, height 5\' 2"  (1.575 m), weight 126 lb 9.6 oz (57.4 kg), SpO2 100 %.  LABORATORY DATA: Lab Results  Component Value Date   WBC 7.2 02/20/2021   HGB 13.4 02/20/2021   HCT 39.4 02/20/2021   MCV 94.9 02/20/2021   PLT 251 02/20/2021      Chemistry      Component Value Date/Time   NA 136 01/25/2021 1601   K 3.9 01/25/2021 1601   CL 99 01/25/2021 1601   CO2 28 01/25/2021 1601   BUN 8 01/25/2021  1601   CREATININE 1.06 01/25/2021 1601   CREATININE 1.02 (H) 11/17/2020 0952      Component Value Date/Time   CALCIUM 9.5 01/25/2021 1601   ALKPHOS 50 11/17/2020 0952   AST 18 11/17/2020 0952   ALT 17 11/17/2020 0952   BILITOT 1.1 11/17/2020 0952       RADIOGRAPHIC STUDIES: No results found.  ASSESSMENT AND PLAN: This is a very pleasant 45 years old African-American female with essential thrombocythemia and currently on treatment with hydroxyurea 500 mg p.o. daily.  She is also on folic acid  327 mcg p.o. 3 times a week. The patient continues to tolerate her treatment well with no concerning adverse effects. Repeat CBC today was unremarkable for any abnormality. Comprehensive metabolic panel is still pending. I recommended for the patient to continue her current treatment with hydroxyurea and folic acid. I will see her back for follow-up visit in 3 months for evaluation and repeat blood work. She was advised to call immediately if she has any concerning symptoms in the interval. The patient voices understanding of current disease status and treatment options and is in agreement with the current care plan. All questions were answered. The patient knows to call the clinic with any problems, questions or concerns. We can certainly see the patient much sooner if necessary.  Disclaimer: This note was dictated with voice recognition software. Similar sounding words can inadvertently be transcribed and may not be corrected upon review.

## 2021-05-22 ENCOUNTER — Inpatient Hospital Stay: Payer: 59 | Attending: Internal Medicine

## 2021-05-22 ENCOUNTER — Inpatient Hospital Stay (HOSPITAL_BASED_OUTPATIENT_CLINIC_OR_DEPARTMENT_OTHER): Payer: 59 | Admitting: Internal Medicine

## 2021-05-22 ENCOUNTER — Encounter: Payer: Self-pay | Admitting: Gastroenterology

## 2021-05-22 ENCOUNTER — Other Ambulatory Visit: Payer: Self-pay

## 2021-05-22 VITALS — BP 127/81 | HR 83 | Temp 97.5°F | Resp 18 | Ht 62.0 in | Wt 127.4 lb

## 2021-05-22 DIAGNOSIS — D473 Essential (hemorrhagic) thrombocythemia: Secondary | ICD-10-CM | POA: Diagnosis present

## 2021-05-22 LAB — CMP (CANCER CENTER ONLY)
ALT: 18 U/L (ref 0–44)
AST: 21 U/L (ref 15–41)
Albumin: 4.1 g/dL (ref 3.5–5.0)
Alkaline Phosphatase: 55 U/L (ref 38–126)
Anion gap: 12 (ref 5–15)
BUN: 11 mg/dL (ref 6–20)
CO2: 28 mmol/L (ref 22–32)
Calcium: 9.8 mg/dL (ref 8.9–10.3)
Chloride: 101 mmol/L (ref 98–111)
Creatinine: 1.01 mg/dL — ABNORMAL HIGH (ref 0.44–1.00)
GFR, Estimated: >60 mL/min
Glucose, Bld: 82 mg/dL (ref 70–99)
Potassium: 4.1 mmol/L (ref 3.5–5.1)
Sodium: 141 mmol/L (ref 135–145)
Total Bilirubin: 1.7 mg/dL — ABNORMAL HIGH (ref 0.3–1.2)
Total Protein: 7.5 g/dL (ref 6.5–8.1)

## 2021-05-22 LAB — CBC WITH DIFFERENTIAL (CANCER CENTER ONLY)
Abs Immature Granulocytes: 0.02 K/uL (ref 0.00–0.07)
Basophils Absolute: 0.1 K/uL (ref 0.0–0.1)
Basophils Relative: 1 %
Eosinophils Absolute: 0.1 K/uL (ref 0.0–0.5)
Eosinophils Relative: 1 %
HCT: 38.5 % (ref 36.0–46.0)
Hemoglobin: 12.9 g/dL (ref 12.0–15.0)
Immature Granulocytes: 0 %
Lymphocytes Relative: 34 %
Lymphs Abs: 2.3 K/uL (ref 0.7–4.0)
MCH: 32.5 pg (ref 26.0–34.0)
MCHC: 33.5 g/dL (ref 30.0–36.0)
MCV: 97 fL (ref 80.0–100.0)
Monocytes Absolute: 0.7 K/uL (ref 0.1–1.0)
Monocytes Relative: 10 %
Neutro Abs: 3.7 K/uL (ref 1.7–7.7)
Neutrophils Relative %: 54 %
Platelet Count: 273 K/uL (ref 150–400)
RBC: 3.97 MIL/uL (ref 3.87–5.11)
RDW: 12.5 % (ref 11.5–15.5)
WBC Count: 6.8 K/uL (ref 4.0–10.5)
nRBC: 0 % (ref 0.0–0.2)

## 2021-05-22 LAB — LACTATE DEHYDROGENASE: LDH: 176 U/L (ref 98–192)

## 2021-05-22 NOTE — Progress Notes (Signed)
[place] Telephone:([phone]   Fax:(336) [place], [place] [address] [place] [address]  DIAGNOSIS: Essential thrombocythemia diagnosed in May 2019.  Positive for JAK 2 mutation.   PRIOR THERAPY: None   CURRENT THERAPY: Hydroxyurea 500 mg daily.  The first dose was started on 01/01/2018.    INTERVAL HISTORY: Darlene Davis 45 y.o. female returns to the clinic today for follow-up visit.  The patient is feeling fine today with no concerning complaints.  She denied having any chest pain, shortness of breath, cough or hemoptysis.  She denied having any nausea, vomiting, diarrhea or constipation.  She has no headache or visual changes.  She was referred to gastroenterology for colonoscopy and expected to be done in the next few months.  The patient is here today for evaluation and repeat blood work.   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  MEDICATIONS:  Current Outpatient Medications  Medication Sig Dispense Refill   Ascorbic Acid (VITAMIN C PO) Take by mouth.     Cholecalciferol (VITAMIN D PO) Take by mouth.     folic acid (FOLVITE) 676 MCG tablet Take 400 mcg by mouth 3 (three) times a week.     hydroxyurea (HYDREA) 500 MG capsule Take 1 capsule (500 mg total) by mouth daily. May take with food to minimize GI side effects. 90 capsule 1   Multiple Vitamins-Iron (MULTIVITAMINS WITH IRON) TABS tablet Take 1 tablet by mouth daily.     prochlorperazine (COMPAZINE) 10 MG tablet Take 1 tablet (10 mg total) by mouth every 6 (six) hours as needed for nausea or vomiting. 30 tablet 0   SPRINTEC 28 0.25-35 MG-MCG tablet Take 1 tablet by mouth daily.     valACYclovir (VALTREX) 1000 MG tablet Take by mouth.     No current facility-administered medications for this  visit.    SURGICAL HISTORY:  Past Surgical History:  Procedure Laterality Date   NO PAST SURGERIES      REVIEW OF SYSTEMS:  A comprehensive review of systems was negative.   PHYSICAL EXAMINATION: General appearance: alert, cooperative, and no distress Head: Normocephalic, without obvious abnormality, atraumatic Neck: no adenopathy, no JVD, supple, symmetrical, trachea midline, and thyroid not enlarged, symmetric, no tenderness/mass/nodules Lymph nodes: Cervical, supraclavicular, and axillary nodes normal. Resp: clear to auscultation bilaterally Back: symmetric, no curvature. ROM normal. No CVA tenderness. Cardio: regular rate and rhythm, S1, S2 normal, no murmur, click, rub or gallop GI: soft, non-tender; bowel sounds normal; no masses,  no organomegaly Extremities: extremities normal, atraumatic, no cyanosis or edema  ECOG PERFORMANCE STATUS: 0 - Asymptomatic  Blood pressure 127/81, pulse 83, temperature (!) 97.5 F (36.4 C), temperature source Tympanic, resp. rate 18, height 5\' 2"  (1.575 m), weight 127 lb 6.4 oz (57.8 kg), SpO2 100 %.  LABORATORY DATA: Lab Results  Component Value Date   WBC 7.2 02/20/2021   HGB 13.4 02/20/2021   HCT 39.4 02/20/2021   MCV 94.9 02/20/2021   PLT 251 02/20/2021      Chemistry      Component Value Date/Time   NA 142 02/20/2021 0836   K 4.6 02/20/2021 0836   CL 103 02/20/2021 0836   CO2 31 02/20/2021 0836   BUN 13 02/20/2021 0836  CREATININE 1.08 (H) 02/20/2021 0836      Component Value Date/Time   CALCIUM 9.4 02/20/2021 0836   ALKPHOS 51 02/20/2021 0836   AST 21 02/20/2021 0836   ALT 17 02/20/2021 0836   BILITOT 1.4 (H) 02/20/2021 0836       RADIOGRAPHIC STUDIES: No results found.  ASSESSMENT AND PLAN: This is a very pleasant 45 years old African-American female with essential thrombocythemia and currently on treatment with hydroxyurea 500 mg p.o. daily.  She is also on folic acid 128 mcg p.o. 3 times a week. The patient has  been tolerating this treatment well with no concerning adverse effects. Repeat CBC today is unremarkable.  Comprehensive metabolic panel and LDH are still pending. I recommended for the patient to continue her current treatment with hydroxyurea and folic acid. I will see her back for follow-up visit in 3 months for evaluation and repeat blood work. She was advised to call immediately if she has any other concerning symptoms in the interval. The patient voices understanding of current disease status and treatment options and is in agreement with the current care plan. All questions were answered. The patient knows to call the clinic with any problems, questions or concerns. We can certainly see the patient much sooner if necessary.  Disclaimer: This note was dictated with voice recognition software. Similar sounding words can inadvertently be transcribed and may not be corrected upon review.

## 2021-05-23 ENCOUNTER — Telehealth: Payer: Self-pay | Admitting: Gastroenterology

## 2021-05-23 ENCOUNTER — Encounter: Payer: Self-pay | Admitting: Gastroenterology

## 2021-05-23 NOTE — Telephone Encounter (Signed)
error 

## 2021-05-24 ENCOUNTER — Other Ambulatory Visit: Payer: 59

## 2021-05-24 ENCOUNTER — Ambulatory Visit: Payer: 59 | Admitting: Internal Medicine

## 2021-06-06 ENCOUNTER — Other Ambulatory Visit: Payer: 59

## 2021-06-06 ENCOUNTER — Ambulatory Visit: Payer: 59 | Admitting: Internal Medicine

## 2021-06-28 ENCOUNTER — Encounter: Payer: Self-pay | Admitting: Gastroenterology

## 2021-06-28 ENCOUNTER — Other Ambulatory Visit: Payer: Self-pay

## 2021-06-28 ENCOUNTER — Ambulatory Visit (AMBULATORY_SURGERY_CENTER): Payer: 59

## 2021-06-28 VITALS — Ht 62.0 in | Wt 125.0 lb

## 2021-06-28 DIAGNOSIS — Z1211 Encounter for screening for malignant neoplasm of colon: Secondary | ICD-10-CM

## 2021-06-28 MED ORDER — NA SULFATE-K SULFATE-MG SULF 17.5-3.13-1.6 GM/177ML PO SOLN: 1.0000 | Freq: Once | ORAL | 0 refills | Status: AC

## 2021-06-28 NOTE — Progress Notes (Signed)
  Patient's pre-visit was done today over the phone with the patient   Name,DOB and address verified.   Patient denies any allergies to Eggs and Soy.  Patient denies any problems with anesthesia/sedation--has had no surgeries Patient denies taking diet pills or blood thinners.  Denies atrial flutter or atrial fib Denies chronic constipation No home Oxygen.   Packet of Prep instructions mailed to patient including a copy of a consent form-pt is aware.  Patient understands to call us back with any questions or concerns.  Patient is aware of our care-partner policy and [id number]-[id number] safety protocol.   EMMI education assigned to the patient for the procedure, sent to [facility].   The patient is COVID-19 vaccinated.

## 2021-07-02 ENCOUNTER — Other Ambulatory Visit: Payer: Self-pay | Admitting: Physician Assistant

## 2021-07-02 DIAGNOSIS — D473 Essential (hemorrhagic) thrombocythemia: Secondary | ICD-10-CM

## 2021-07-12 ENCOUNTER — Encounter: Payer: 59 | Admitting: Gastroenterology

## 2021-07-17 ENCOUNTER — Encounter: Payer: Self-pay | Admitting: Gastroenterology

## 2021-07-17 ENCOUNTER — Other Ambulatory Visit: Payer: Self-pay

## 2021-07-17 ENCOUNTER — Ambulatory Visit (AMBULATORY_SURGERY_CENTER): Payer: 59 | Admitting: Gastroenterology

## 2021-07-17 VITALS — BP 108/68 | HR 72 | Temp 97.8°F | Resp 14 | Ht 62.0 in | Wt 125.0 lb

## 2021-07-17 DIAGNOSIS — Z1211 Encounter for screening for malignant neoplasm of colon: Secondary | ICD-10-CM | POA: Diagnosis not present

## 2021-07-17 MED ORDER — SODIUM CHLORIDE 0.9 % IV SOLN: 500.0000 mL | Freq: Once | INTRAVENOUS | Status: DC

## 2021-07-17 NOTE — Progress Notes (Signed)
To PACU, VSS. Report to [email or web address] 

## 2021-07-17 NOTE — Patient Instructions (Signed)
Resume previous diet and medications. Repeat Colonoscopy in 10 years for surveillance.  YOU HAD AN ENDOSCOPIC PROCEDURE TODAY AT [facility]:   Refer to the procedure report that was given to you for any specific questions about what was found during the examination.  If the procedure report does not answer your questions, please call your gastroenterologist to clarify.  If you requested that your care partner not be given the details of your procedure findings, then the procedure report has been included in a sealed envelope for you to review at your convenience later.  YOU SHOULD EXPECT: Some feelings of bloating in the abdomen. Passage of more gas than usual.  Walking can help get rid of the air that was put into your GI tract during the procedure and reduce the bloating. If you had a lower endoscopy (such as a colonoscopy or flexible sigmoidoscopy) you may notice spotting of blood in your stool or on the toilet paper. If you underwent a bowel prep for your procedure, you may not have a normal bowel movement for a few days.  Please Note:  You might notice some irritation and congestion in your nose or some drainage.  This is from the oxygen used during your procedure.  There is no need for concern and it should clear up in a day or so.  SYMPTOMS TO REPORT IMMEDIATELY:  Following lower endoscopy (colonoscopy or flexible sigmoidoscopy):  Excessive amounts of blood in the stool  Significant tenderness or worsening of abdominal pains  Swelling of the abdomen that is new, acute  Fever of 100F or higher  For urgent or emergent issues, a gastroenterologist can be reached at any hour by calling [phone]. Do not use MyChart messaging for urgent concerns.    DIET:  We do recommend a small meal at first, but then you may proceed to your regular diet.  Drink plenty of fluids but you should avoid alcoholic beverages for 24 hours.  ACTIVITY:  You should plan to take it easy for the  rest of today and you should NOT DRIVE or use heavy machinery until tomorrow (because of the sedation medicines used during the test).    FOLLOW UP: Our staff will call the number listed on your records 48-72 hours following your procedure to check on you and address any questions or concerns that you may have regarding the information given to you following your procedure. If we do not reach you, we will leave a message.  We will attempt to reach you two times.  During this call, we will ask if you have developed any symptoms of COVID 19. If you develop any symptoms (ie: fever, flu-like symptoms, shortness of breath, cough etc.) before then, please call [phone].  If you test positive for Covid 19 in the 2 weeks post procedure, please call and report this information to [place].    If any biopsies were taken you will be contacted by phone or by letter within the next 1-3 weeks.  Please call us at [phone] if you have not heard about the biopsies in 3 weeks.    SIGNATURES/CONFIDENTIALITY: You and/or your care partner have signed paperwork which will be entered into your electronic medical record.  These signatures attest to the fact that that the information above on your After Visit Summary has been reviewed and is understood.  Full responsibility of the confidentiality of this discharge information lies with you and/or your care-partner.

## 2021-07-17 NOTE — Progress Notes (Signed)
   Referring Provider: Colon Branch, MD Primary Care Physician:  Colon Branch, MD  Reason for Procedure:  Colon cancer screening   IMPRESSION:  Need for colon cancer screening Appropriate candidate for monitored anesthesia care  PLAN: Colonoscopy in the [place] today   HPI: Darlene Davis is a 45 y.o. female presents for screening colonoscopy.  No prior colonoscopy or colon cancer screening.  No baseline GI symptoms.   No known family history of colon cancer or polyps. No family history of uterine/endometrial cancer, pancreatic cancer or gastric/stomach cancer.   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     Cholecalciferol (VITAMIN D PO) Take by mouth.     Multiple Vitamins-Iron (MULTIVITAMINS WITH IRON) TABS tablet Take 1 tablet by mouth daily.     folic acid (FOLVITE) 637 MCG tablet Take 400 mcg by mouth 3 (three) times a week.     hydroxyurea (HYDREA) 500 MG capsule TAKE 1 CAPSULE BY MOUTH ONCE DAILY WITH FOOD TO  MINIMIZE  GI  SIDE  EFFECTS 90 capsule 0   prochlorperazine (COMPAZINE) 10 MG tablet Take 1 tablet (10 mg total) by mouth every 6 (six) hours as needed for nausea or vomiting. (Patient not taking: Reported on 06/28/2021) 30 tablet 0   SPRINTEC 28 0.25-35 MG-MCG tablet Take 1 tablet by mouth daily. (Patient not taking: Reported on 06/28/2021)     valACYclovir (VALTREX) 1000 MG tablet Take by mouth. (Patient not taking: Reported on 07/17/2021)     Current Facility-Administered Medications  Medication Dose Route Frequency Provider Last Rate Last Admin   0.9 %  sodium chloride infusion  500 mL Intravenous Once Thornton Park, MD        Allergies as of 07/17/2021   (No Known  Allergies)    Family History  Problem Relation Age of Onset   Hypertension Brother    Hyperlipidemia Maternal Uncle    Colon cancer Paternal Uncle 29   Diabetes Maternal Grandmother    Colon cancer Cousin 74       liver, hx EtOH   Breast cancer Neg Hx    CAD Neg Hx    Colon polyps Neg Hx    Esophageal cancer Neg Hx    Rectal cancer Neg Hx    Stomach cancer Neg Hx      Physical Exam: General:   Alert,  well-nourished, pleasant and cooperative in NAD Head:  Normocephalic and atraumatic. Eyes:  Sclera clear, no icterus.   Conjunctiva pink. Mouth:  No deformity or lesions.   Neck:  Supple; no masses or thyromegaly. Lungs:  Clear throughout to auscultation.   No wheezes. Heart:  Regular rate and rhythm; no murmurs. Abdomen:  Soft, non-tender, nondistended, normal bowel sounds, no rebound or guarding.  Msk:  Symmetrical. No boney deformities LAD: No inguinal or umbilical LAD Extremities:  No clubbing or edema. Neurologic:  Alert and  oriented x4;  grossly nonfocal Skin:  No obvious rash or bruise. Psych:  Alert and cooperative. Normal mood and affect.     Studies/Results: No results found.    Everette Dimauro L. Tarri Glenn, MD, MPH 07/17/2021, 9:56 AM

## 2021-07-17 NOTE — Op Note (Signed)
[place] Patient Name: Darlene Davis Procedure Date: 07/17/2021 9:58 AM MRN: [id number] Endoscopist: Thornton Park MD, MD Age: 45 Referring MD:  Date of Birth: 1975-11-17 Gender: Female Account #: [id number] Procedure:                Colonoscopy Indications:              Screening for colorectal malignant neoplasm, This                            is the patient's first colonoscopy                           No known family history of colon cancer or polyps Medicines:                Monitored Anesthesia Care Procedure:                Pre-Anesthesia Assessment:                           - Prior to the procedure, a History and Physical                            was performed, and patient medications and                            allergies were reviewed. The patient's tolerance of                            previous anesthesia was also reviewed. The risks                            and benefits of the procedure and the sedation                            options and risks were discussed with the patient.                            All questions were answered, and informed consent                            was obtained. Prior Anticoagulants: The patient has                            taken no previous anticoagulant or antiplatelet                            agents. ASA Grade Assessment: II - A patient with                            mild systemic disease. After reviewing the risks                            and benefits, the patient was deemed in  satisfactory condition to undergo the procedure.                           After obtaining informed consent, the colonoscope                            was passed under direct vision. Throughout the                            procedure, the patient's blood pressure, pulse, and                            oxygen saturations were monitored continuously. The                            CF [id number] #[id number] was  introduced through the anus                            and advanced to the 3 cm into the ileum. A second                            forward view of the right colon was performed. The                            colonoscopy was performed without difficulty. The                            patient tolerated the procedure well. The quality                            of the bowel preparation was good. The terminal                            ileum, ileocecal valve, appendiceal orifice, and                            rectum were photographed. Scope In: 10:15:47 AM Scope Out: 10:28:09 AM Scope Withdrawal Time: 0 hours 8 minutes 17 seconds  Total Procedure Duration: 0 hours 12 minutes 22 seconds  Findings:                 The perianal and digital rectal examinations were                            normal.                           The exam was otherwise without abnormality on                            direct and retroflexion views. Complications:            No immediate complications. Estimated Blood Loss:     Estimated blood loss: none. Impression:               -  The examination was otherwise normal on direct                            and retroflexion views.                           - No specimens collected. Recommendation:           - Patient has a contact number available for                            emergencies. The signs and symptoms of potential                            delayed complications were discussed with the                            patient. Return to normal activities tomorrow.                            Written discharge instructions were provided to the                            patient.                           - Resume previous diet.                           - Continue present medications.                           - Repeat colonoscopy in 10 years for surveillance,                            earlier with new symptoms.                           - Emerging evidence  supports eating a diet of                            fruits, vegetables, grains, calcium, and yogurt                            while reducing red meat and alcohol may reduce the                            risk of colon cancer.                           - Thank you for allowing me to be involved in your                            colon cancer prevention. Thornton Park MD, MD 07/17/2021 10:38:43 AM This report has been signed electronically.

## 2021-07-17 NOTE — Progress Notes (Signed)
Pt's states no medical or surgical changes since previsit or office visit. VS by DT. °

## 2021-07-19 ENCOUNTER — Telehealth: Payer: Self-pay | Admitting: *Deleted

## 2021-07-19 NOTE — Telephone Encounter (Signed)
°  Follow up Call-  Call back number 07/17/2021  Post procedure Call Back phone  # [phone]  Permission to leave phone message Yes  Some recent data might be hidden     Patient questions:  Do you have a fever, pain , or abdominal swelling? No. Pain Score  0 *  Have you tolerated food without any problems? Yes.    Have you been able to return to your normal activities? Yes.    Do you have any questions about your discharge instructions: Diet   No. Medications  No. Follow up visit  No.  Do you have questions or concerns about your Care? No.  Actions: * If pain score is 4 or above: No action needed, pain <4.

## 2021-08-14 ENCOUNTER — Inpatient Hospital Stay: Payer: 59 | Attending: Internal Medicine

## 2021-08-14 ENCOUNTER — Inpatient Hospital Stay (HOSPITAL_BASED_OUTPATIENT_CLINIC_OR_DEPARTMENT_OTHER): Payer: 59 | Admitting: Internal Medicine

## 2021-08-14 ENCOUNTER — Other Ambulatory Visit: Payer: Self-pay

## 2021-08-14 ENCOUNTER — Encounter: Payer: Self-pay | Admitting: Internal Medicine

## 2021-08-14 VITALS — BP 110/79 | HR 87 | Temp 98.1°F | Resp 16 | Ht 62.0 in | Wt 127.9 lb

## 2021-08-14 DIAGNOSIS — D473 Essential (hemorrhagic) thrombocythemia: Secondary | ICD-10-CM

## 2021-08-14 LAB — CBC WITH DIFFERENTIAL (CANCER CENTER ONLY)
Abs Immature Granulocytes: 0.01 10*3/uL (ref 0.00–0.07)
Basophils Absolute: 0 10*3/uL (ref 0.0–0.1)
Basophils Relative: 1 %
Eosinophils Absolute: 0.1 10*3/uL (ref 0.0–0.5)
Eosinophils Relative: 2 %
HCT: 39.4 % (ref 36.0–46.0)
Hemoglobin: 13.5 g/dL (ref 12.0–15.0)
Immature Granulocytes: 0 %
Lymphocytes Relative: 43 %
Lymphs Abs: 2.5 10*3/uL (ref 0.7–4.0)
MCH: 32.9 pg (ref 26.0–34.0)
MCHC: 34.3 g/dL (ref 30.0–36.0)
MCV: 96.1 fL (ref 80.0–100.0)
Monocytes Absolute: 0.7 10*3/uL (ref 0.1–1.0)
Monocytes Relative: 11 %
Neutro Abs: 2.5 10*3/uL (ref 1.7–7.7)
Neutrophils Relative %: 43 %
Platelet Count: 257 10*3/uL (ref 150–400)
RBC: 4.1 MIL/uL (ref 3.87–5.11)
RDW: 12.3 % (ref 11.5–15.5)
WBC Count: 5.8 10*3/uL (ref 4.0–10.5)
nRBC: 0 % (ref 0.0–0.2)

## 2021-08-14 LAB — CMP (CANCER CENTER ONLY)
ALT: 24 U/L (ref 0–44)
AST: 20 U/L (ref 15–41)
Albumin: 4.2 g/dL (ref 3.5–5.0)
Alkaline Phosphatase: 61 U/L (ref 38–126)
Anion gap: 5 (ref 5–15)
BUN: 13 mg/dL (ref 6–20)
CO2: 33 mmol/L — ABNORMAL HIGH (ref 22–32)
Calcium: 9.5 mg/dL (ref 8.9–10.3)
Chloride: 103 mmol/L (ref 98–111)
Creatinine: 0.99 mg/dL (ref 0.44–1.00)
GFR, Estimated: >60 mL/min (ref >60)
Glucose, Bld: 74 mg/dL (ref 70–99)
Potassium: 3.8 mmol/L (ref 3.5–5.1)
Sodium: 141 mmol/L (ref 135–145)
Total Bilirubin: 1.4 mg/dL — ABNORMAL HIGH (ref 0.3–1.2)
Total Protein: 7.3 g/dL (ref 6.5–8.1)

## 2021-08-14 LAB — LACTATE DEHYDROGENASE: LDH: 151 U/L (ref 98–192)

## 2021-08-14 NOTE — Progress Notes (Signed)
Darlene Davis Telephone:([phone]   Fax:(336) [place], [place] [address] [place] [address]  DIAGNOSIS: Essential thrombocythemia diagnosed in May 2019.  Positive for JAK 2 mutation.   PRIOR THERAPY: None   CURRENT THERAPY: Hydroxyurea 500 mg daily.  The first dose was started on 01/01/2018.    INTERVAL HISTORY: Darlene Davis 46 y.o. female returns to the clinic today for 60-month follow-up visit.  The patient is feeling fine today with no concerning complaints.  She denied having any current chest pain, shortness of breath, cough or hemoptysis.  She denied having any fever or chills.  She has no nausea, vomiting, diarrhea or constipation.  She has no headache or visual changes.  She had a recent colonoscopy that was unremarkable according to her.  She denied having any weight loss or night sweats.  She is here today for evaluation and repeat blood work.   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  MEDICATIONS:  Current Outpatient Medications  Medication Sig Dispense Refill   Ascorbic Acid (VITAMIN C PO) Take by mouth.     Cholecalciferol (VITAMIN D PO) Take by mouth.     folic acid (FOLVITE) 604 MCG tablet Take 400 mcg by mouth 3 (three) times a week.     hydroxyurea (HYDREA) 500 MG capsule TAKE 1 CAPSULE BY MOUTH ONCE DAILY WITH FOOD TO  MINIMIZE  GI  SIDE  EFFECTS 90 capsule 0   Multiple Vitamins-Iron (MULTIVITAMINS WITH IRON) TABS tablet Take 1 tablet by mouth daily.     prochlorperazine (COMPAZINE) 10 MG tablet Take 1 tablet (10 mg total) by mouth every 6 (six) hours as needed for nausea or vomiting. (Patient not taking: Reported on 06/28/2021) 30 tablet 0   SPRINTEC 28 0.25-35 MG-MCG tablet Take 1 tablet by mouth daily. (Patient not taking: Reported on  06/28/2021)     valACYclovir (VALTREX) 1000 MG tablet Take by mouth. (Patient not taking: Reported on 07/17/2021)     No current facility-administered medications for this visit.    SURGICAL HISTORY:  Past Surgical History:  Procedure Laterality Date   NO PAST SURGERIES      REVIEW OF SYSTEMS:  A comprehensive review of systems was negative.   PHYSICAL EXAMINATION: General appearance: alert, cooperative, and no distress Head: Normocephalic, without obvious abnormality, atraumatic Neck: no adenopathy, no JVD, supple, symmetrical, trachea midline, and thyroid not enlarged, symmetric, no tenderness/mass/nodules Lymph nodes: Cervical, supraclavicular, and axillary nodes normal. Resp: clear to auscultation bilaterally Back: symmetric, no curvature. ROM normal. No CVA tenderness. Cardio: regular rate and rhythm, S1, S2 normal, no murmur, click, rub or gallop GI: soft, non-tender; bowel sounds normal; no masses,  no organomegaly Extremities: extremities normal, atraumatic, no cyanosis or edema  ECOG PERFORMANCE STATUS: 0 - Asymptomatic  Blood pressure 110/79, pulse 87, temperature 98.1 F (36.7 C), temperature source Temporal, resp. rate 16, height 5\' 2"  (1.575 m), weight 127 lb 14.4 oz (58 kg), SpO2 100 %.  LABORATORY DATA: Lab Results  Component Value Date   WBC 5.8 08/14/2021   HGB 13.5 08/14/2021   HCT 39.4 08/14/2021   MCV 96.1 08/14/2021   PLT 257 08/14/2021      Chemistry      Component Value Date/Time  NA 141 05/22/2021 0813   K 4.1 05/22/2021 0813   CL 101 05/22/2021 0813   CO2 28 05/22/2021 0813   BUN 11 05/22/2021 0813   CREATININE 1.01 (H) 05/22/2021 0813      Component Value Date/Time   CALCIUM 9.8 05/22/2021 0813   ALKPHOS 55 05/22/2021 0813   AST 21 05/22/2021 0813   ALT 18 05/22/2021 0813   BILITOT 1.7 (H) 05/22/2021 0813       RADIOGRAPHIC STUDIES: No results found.  ASSESSMENT AND PLAN: This is a very pleasant 46 years old African-American  female with essential thrombocythemia and currently on treatment with hydroxyurea 500 mg p.o. daily.  She is also on folic acid 282 mcg p.o. 3 times a week. The patient has been tolerating her treatment with hydroxyurea fairly well.  She has no concerning complaints today. Repeat CBC is unremarkable for any abnormality.  Comprehensive metabolic panel and LDH are still pending. I recommended for the patient to continue her current treatment with hydroxyurea with the same dose. I will see her back for follow-up visit in 4 months with repeat CBC, comprehensive metabolic panel and LDH. The patient was advised to call immediately if she has any concerning symptoms in the interval. The patient voices understanding of current disease status and treatment options and is in agreement with the current care plan. All questions were answered. The patient knows to call the clinic with any problems, questions or concerns. We can certainly see the patient much sooner if necessary.  Disclaimer: This note was dictated with voice recognition software. Similar sounding words can inadvertently be transcribed and may not be corrected upon review.

## 2021-08-22 ENCOUNTER — Ambulatory Visit: Payer: 59 | Admitting: Internal Medicine

## 2021-08-22 ENCOUNTER — Other Ambulatory Visit: Payer: 59

## 2021-10-04 DIAGNOSIS — R87610 Atypical squamous cells of undetermined significance on cytologic smear of cervix (ASC-US): Secondary | ICD-10-CM

## 2021-10-04 HISTORY — DX: Atypical squamous cells of undetermined significance on cytologic smear of cervix (ASC-US): R87.610

## 2021-10-07 ENCOUNTER — Other Ambulatory Visit: Payer: Self-pay | Admitting: Physician Assistant

## 2021-10-07 DIAGNOSIS — D473 Essential (hemorrhagic) thrombocythemia: Secondary | ICD-10-CM

## 2021-10-20 LAB — HM PAP SMEAR

## 2021-10-20 LAB — RESULTS CONSOLE HPV: CHL HPV: POSITIVE

## 2021-12-11 ENCOUNTER — Inpatient Hospital Stay: Payer: 59 | Attending: Internal Medicine

## 2021-12-11 ENCOUNTER — Inpatient Hospital Stay (HOSPITAL_BASED_OUTPATIENT_CLINIC_OR_DEPARTMENT_OTHER): Payer: 59 | Admitting: Internal Medicine

## 2021-12-11 ENCOUNTER — Other Ambulatory Visit: Payer: Self-pay

## 2021-12-11 VITALS — BP 122/81 | HR 75 | Temp 97.3°F | Resp 16 | Wt 132.0 lb

## 2021-12-11 DIAGNOSIS — D473 Essential (hemorrhagic) thrombocythemia: Secondary | ICD-10-CM | POA: Diagnosis not present

## 2021-12-11 DIAGNOSIS — Z7982 Long term (current) use of aspirin: Secondary | ICD-10-CM | POA: Diagnosis not present

## 2021-12-11 LAB — CMP (CANCER CENTER ONLY)
ALT: 17 U/L (ref 0–44)
AST: 18 U/L (ref 15–41)
Albumin: 3.9 g/dL (ref 3.5–5.0)
Alkaline Phosphatase: 57 U/L (ref 38–126)
Anion gap: 7 (ref 5–15)
BUN: 10 mg/dL (ref 6–20)
CO2: 29 mmol/L (ref 22–32)
Calcium: 9.2 mg/dL (ref 8.9–10.3)
Chloride: 103 mmol/L (ref 98–111)
Creatinine: 1.11 mg/dL — ABNORMAL HIGH (ref 0.44–1.00)
GFR, Estimated: >60 mL/min (ref >60)
Glucose, Bld: 79 mg/dL (ref 70–99)
Potassium: 4 mmol/L (ref 3.5–5.1)
Sodium: 139 mmol/L (ref 135–145)
Total Bilirubin: 0.9 mg/dL (ref 0.3–1.2)
Total Protein: 7.4 g/dL (ref 6.5–8.1)

## 2021-12-11 LAB — CBC WITH DIFFERENTIAL (CANCER CENTER ONLY)
Abs Immature Granulocytes: 0 10*3/uL (ref 0.00–0.07)
Basophils Absolute: 0.1 10*3/uL (ref 0.0–0.1)
Basophils Relative: 1 %
Eosinophils Absolute: 0.1 10*3/uL (ref 0.0–0.5)
Eosinophils Relative: 2 %
HCT: 39.3 % (ref 36.0–46.0)
Hemoglobin: 13.1 g/dL (ref 12.0–15.0)
Immature Granulocytes: 0 %
Lymphocytes Relative: 40 %
Lymphs Abs: 2.5 10*3/uL (ref 0.7–4.0)
MCH: 32.6 pg (ref 26.0–34.0)
MCHC: 33.3 g/dL (ref 30.0–36.0)
MCV: 97.8 fL (ref 80.0–100.0)
Monocytes Absolute: 0.6 10*3/uL (ref 0.1–1.0)
Monocytes Relative: 9 %
Neutro Abs: 3.1 10*3/uL (ref 1.7–7.7)
Neutrophils Relative %: 48 %
Platelet Count: 266 10*3/uL (ref 150–400)
RBC: 4.02 MIL/uL (ref 3.87–5.11)
RDW: 12.2 % (ref 11.5–15.5)
WBC Count: 6.4 10*3/uL (ref 4.0–10.5)
nRBC: 0 % (ref 0.0–0.2)

## 2021-12-11 LAB — LACTATE DEHYDROGENASE: LDH: 140 U/L (ref 98–192)

## 2021-12-11 NOTE — Progress Notes (Signed)
?    [facility] ?[phone]   [phone] ? ?OFFICE PROGRESS NOTE ? ?Colon Branch, MD ?[address] ?[place] [address] ? ?DIAGNOSIS: Essential thrombocythemia diagnosed in May 2019.  Positive for JAK 2 mutation. ?  ?PRIOR THERAPY: Hydroxyurea 500 mg daily.  The first dose was started on 01/01/2018.  Discontinued 12/11/2021 ?  ?CURRENT THERAPY: Aspirin 325 mg p.o. daily started Dec 11, 2021 ?INTERVAL HISTORY: ?Darlene Davis 46 y.o. female returns to the clinic today for follow-up visit.  The patient is feeling fine today with no concerning complaints.  She denied having any chest pain, shortness of breath, cough or hemoptysis.  She denied having any bleeding, bruises or ecchymosis.  She has no nausea, vomiting, diarrhea or constipation.  She has no headache or visual changes.  She has been tolerating her treatment with hydroxyurea fairly well.  The patient is here today for evaluation and repeat blood work. ? ?MEDICAL HISTORY: ?Past Medical History:  ?Diagnosis Date  ? Essential thrombocythemia ([facility]) 01/01/2018  ? 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 ? ?ALLERGIES:  has No Known Allergies. ? ?MEDICATIONS:  ?Current Outpatient Medications  ?Medication Sig Dispense Refill  ? Ascorbic Acid (VITAMIN C PO) Take by mouth.    ? Cholecalciferol (VITAMIN D PO) Take by mouth.    ? folic acid (FOLVITE) 353 MCG tablet Take 400 mcg by mouth 3 (three) times a week.    ? hydroxyurea (HYDREA) 500 MG capsule TAKE 1 CAPSULE BY MOUTH ONCE DAILY WITH FOOD TO  MINIMIZE  GI  SIDE  EFFECTS 90 capsule 0  ? Multiple Vitamins-Iron (MULTIVITAMINS WITH IRON) TABS tablet Take 1 tablet by mouth daily.    ? prochlorperazine (COMPAZINE) 10 MG tablet Take 1 tablet (10 mg total) by mouth every 6 (six) hours as needed for nausea or vomiting. (Patient not taking: Reported on 06/28/2021) 30 tablet 0  ? SPRINTEC 28 0.25-35 MG-MCG tablet Take 1 tablet by mouth daily. (Patient not  taking: Reported on 06/28/2021)    ? valACYclovir (VALTREX) 1000 MG tablet Take by mouth. (Patient not taking: Reported on 07/17/2021)    ? ?No current facility-administered medications for this visit.  ? ? ?SURGICAL HISTORY:  ?Past Surgical History:  ?Procedure Laterality Date  ? NO PAST SURGERIES    ? ? ?REVIEW OF SYSTEMS:  A comprehensive review of systems was negative.  ? ?PHYSICAL EXAMINATION: General appearance: alert, cooperative, and no distress ?Head: Normocephalic, without obvious abnormality, atraumatic ?Neck: no adenopathy, no JVD, supple, symmetrical, trachea midline, and thyroid not enlarged, symmetric, no tenderness/mass/nodules ?Lymph nodes: Cervical, supraclavicular, and axillary nodes normal. ?Resp: clear to auscultation bilaterally ?Back: symmetric, no curvature. ROM normal. No CVA tenderness. ?Cardio: regular rate and rhythm, S1, S2 normal, no murmur, click, rub or gallop ?GI: soft, non-tender; bowel sounds normal; no masses,  no organomegaly ?Extremities: extremities normal, atraumatic, no cyanosis or edema ? ?ECOG PERFORMANCE STATUS: 0 - Asymptomatic ? ?Blood pressure 122/81, pulse 75, temperature (!) 97.3 ?F (36.3 ?C), temperature source Tympanic, resp. rate 16, weight 132 lb (59.9 kg), SpO2 100 %. ? ?LABORATORY DATA: ?Lab Results  ?Component Value Date  ? WBC 6.4 12/11/2021  ? HGB 13.1 12/11/2021  ? HCT 39.3 12/11/2021  ? MCV 97.8 12/11/2021  ? PLT 266 12/11/2021  ? ? ?  Chemistry   ?   ?Component Value Date/Time  ? NA 141 08/14/2021 0811  ? K 3.8 08/14/2021  8811  ? CL 103 08/14/2021 0811  ? CO2 33 (H) 08/14/2021 0315  ? BUN 13 08/14/2021 0811  ? CREATININE 0.99 08/14/2021 0811  ?    ?Component Value Date/Time  ? CALCIUM 9.5 08/14/2021 0811  ? ALKPHOS 61 08/14/2021 0811  ? AST 20 08/14/2021 0811  ? ALT 24 08/14/2021 0811  ? BILITOT 1.4 (H) 08/14/2021 9458  ?  ? ? ? ?RADIOGRAPHIC STUDIES: ?No results found. ? ?ASSESSMENT AND PLAN: This is a very pleasant 46 years old African-American female  with essential thrombocythemia and currently on treatment with hydroxyurea 500 mg p.o. daily.  She is also on folic acid 592 mcg p.o. 3 times a week. ?The patient has been tolerating this treatment with hydroxyurea fairly well. ?CBC today is unremarkable.  I explained to the patient that the new guideline is to consider aspirin 325 mg p.o. daily for younger patient with essential thrombocythemia and to keep the hydroxyurea for any future progression. ?She will discontinue hydroxyurea today and start aspirin 325 mg p.o. daily. ?I will see the patient back for follow-up visit in 3 months for evaluation and repeat blood work. ?The patient was advised to call immediately if she has any other concerning symptoms in the interval. ?The patient voices understanding of current disease status and treatment options and is in agreement with the current care plan. ?All questions were answered. The patient knows to call the clinic with any problems, questions or concerns. We can certainly see the patient much sooner if necessary. ? ?Disclaimer: This note was dictated with voice recognition software. Similar sounding words can inadvertently be transcribed and may not be corrected upon review. ? ? ?  ?  ?

## 2021-12-12 LAB — HM MAMMOGRAPHY

## 2021-12-13 ENCOUNTER — Encounter: Payer: Self-pay | Admitting: Internal Medicine

## 2022-01-04 ENCOUNTER — Encounter: Payer: Self-pay | Admitting: Internal Medicine

## 2022-01-04 DIAGNOSIS — R87610 Atypical squamous cells of undetermined significance on cytologic smear of cervix (ASC-US): Secondary | ICD-10-CM

## 2022-01-26 ENCOUNTER — Ambulatory Visit (INDEPENDENT_AMBULATORY_CARE_PROVIDER_SITE_OTHER): Payer: 59 | Admitting: Internal Medicine

## 2022-01-26 ENCOUNTER — Encounter: Payer: Self-pay | Admitting: Internal Medicine

## 2022-01-26 VITALS — BP 108/82 | HR 69 | Temp 98.3°F | Resp 16 | Ht 62.0 in | Wt 130.4 lb

## 2022-01-26 DIAGNOSIS — R0789 Other chest pain: Secondary | ICD-10-CM | POA: Diagnosis not present

## 2022-01-26 DIAGNOSIS — Z0000 Encounter for general adult medical examination without abnormal findings: Secondary | ICD-10-CM

## 2022-01-26 NOTE — Progress Notes (Signed)
Subjective:    Patient ID: Darlene Davis, female    DOB: 1975/08/17, 46 y.o.   MRN: [id number]  DOS:  01/26/2022 Type of visit - description: cpx  Feeling well. She did report a ill defined  discomfort sometimes at the chest (tightness) and sometimes at the abdomen. She is not exercising regularly because is taking care of her mother however symptoms have no obvious relationship with walking. Denies any clear-cut heartburn. Sxs related to certain foods ?  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    Past Surgical History:  Procedure Laterality Date   NO PAST SURGERIES     Social History   Socioeconomic History   Marital status: Single    Spouse name: Not on file   Number of children: 0   Years of education: Not on file   Highest education level: Not on file  Occupational History   Occupation: Scientist, physiological, [employer]  Tobacco Use   Smoking status: Never   Smokeless tobacco: Never   Tobacco comments:       Vaping Use   Vaping Use: Never used  Substance and Sexual Activity   Alcohol use: Not Currently    Comment: rarely    Drug use: Never   Sexual activity: Not on file  Other Topics Concern   Not on file  Social History Narrative   Lived by herself   Social Determinants of Health   Financial Resource Strain: Low Risk  (01/28/2019)   Overall Financial Resource Strain (CARDIA)    Difficulty of Paying Living Expenses: Not hard at all  Food Insecurity: No Food Insecurity (01/28/2019)   Hunger Vital Sign    Worried About Running Out of Food in the Last Year: Never true    Ran Out of Food in the Last Year: Never true   Transportation Needs: No Transportation Needs (01/28/2019)   PRAPARE - Administrator, Civil Service (Medical): No    Lack of Transportation (Non-Medical): No  Physical Activity: Insufficiently Active (01/28/2019)   Exercise Vital Sign    Days of Exercise per Week: 4 days    Minutes of Exercise per Session: 30 min  Stress: No Stress Concern Present (01/28/2019)   [employer] of Occupational Health - Occupational Stress Questionnaire    Feeling of Stress : Only a little  Social Connections: Unknown (01/28/2019)   Social Connection and Isolation Panel [NHANES]    Frequency of Communication with Friends and Family: Three times a week    Frequency of Social Gatherings with Friends and Family: Three times a week    Attends Religious Services: Not on file    Active Member of Clubs or Organizations: Not on file    Attends Club or Organization Meetings: Not on file    Marital Status: Never married  Intimate Partner Violence: Not At Risk (01/28/2019)   Humiliation, Afraid, Rape, and Kick questionnaire    Fear of Current or Ex-Partner: No    Emotionally Abused: No    Physically Abused: No    Sexually Abused: No     Current Outpatient Medications  Medication Instructions   Ascorbic Acid (VITAMIN C PO)  Oral   aspirin EC 81 mg, Oral, Daily, Swallow whole.   Cholecalciferol (VITAMIN D PO) Oral   folic acid (FOLVITE) 400 mcg, Oral, 3 times weekly   Multiple Vitamins-Iron (MULTIVITAMINS WITH IRON) TABS tablet 1 tablet, Oral, Daily   prochlorperazine (COMPAZINE) 10 mg, Oral, Every 6 hours PRN   SPRINTEC 28 0.25-35 MG-MCG tablet 1 tablet, Oral, Daily   valACYclovir (VALTREX) 1000 MG tablet Oral       Objective:   Physical Exam BP 108/82   Pulse 69   Temp 98.3 F (36.8 C) (Oral)   Resp 16   Ht 5\' 2"  (1.575 m)   Wt 130 lb 6 oz (59.1 kg)   LMP 01/19/2022 (Approximate)   SpO2 98%   BMI 23.85 kg/m  General: Well developed, NAD, BMI noted Neck: No  thyromegaly  HEENT:   Normocephalic . Face symmetric, atraumatic Lungs:  CTA B Normal respiratory effort, no intercostal retractions, no accessory muscle use. Heart: RRR,  no murmur.  Abdomen:  Not distended, soft, non-tender. No rebound or rigidity.   Lower extremities: no pretibial edema bilaterally  Skin: Exposed areas without rash. Not pale. Not jaundice Neurologic:  alert & oriented X3.  Speech normal, gait appropriate for age and unassisted Strength symmetric and appropriate for age.  Psych: Cognition and judgment appear intact.  Cooperative with normal attention span and concentration.  Behavior appropriate. No anxious or depressed appearing.     Assessment    Assessment Essential thrombocythemia, sees hem  PLAN Here for CPX Essential thrombocythemia: Last visit with hematology 12/2021, they stopped hydroxyurea and started aspirin  Dyspepsia, chest tightness: Sxs happening twice in the last 6 months, they are ill-defined, she is concerned about her heart, cardiovascular risk is very low. EKG today: No acute changes, PR is 118, slightly short.  She denies palpitations or presyncope. We agreed on observation, red flag symptoms discussed. RTC 1 year

## 2022-01-28 ENCOUNTER — Encounter: Payer: Self-pay | Admitting: Internal Medicine

## 2022-01-28 NOTE — Assessment & Plan Note (Signed)
Td: 2021  COVID vaccines: UTD Female Care: per gyn ; MMG 12/2021 CCS: Negative colonoscopy December 2022, next 10 years per C-scope report Labs reviewed, all very good. Diet exercise counseling RTC 1 year

## 2022-03-12 ENCOUNTER — Other Ambulatory Visit: Payer: Self-pay

## 2022-03-12 ENCOUNTER — Ambulatory Visit: Payer: 59 | Admitting: Internal Medicine

## 2022-03-12 ENCOUNTER — Inpatient Hospital Stay (HOSPITAL_BASED_OUTPATIENT_CLINIC_OR_DEPARTMENT_OTHER): Payer: 59 | Admitting: Internal Medicine

## 2022-03-12 ENCOUNTER — Inpatient Hospital Stay: Payer: 59 | Attending: Internal Medicine

## 2022-03-12 ENCOUNTER — Encounter: Payer: Self-pay | Admitting: Internal Medicine

## 2022-03-12 ENCOUNTER — Other Ambulatory Visit: Payer: 59

## 2022-03-12 VITALS — BP 119/78 | HR 67 | Temp 98.1°F | Resp 16 | Wt 130.1 lb

## 2022-03-12 DIAGNOSIS — D473 Essential (hemorrhagic) thrombocythemia: Secondary | ICD-10-CM | POA: Diagnosis present

## 2022-03-12 DIAGNOSIS — Z7982 Long term (current) use of aspirin: Secondary | ICD-10-CM | POA: Diagnosis not present

## 2022-03-12 LAB — CBC WITH DIFFERENTIAL (CANCER CENTER ONLY)
Abs Immature Granulocytes: 0.01 10*3/uL (ref 0.00–0.07)
Basophils Absolute: 0.1 10*3/uL (ref 0.0–0.1)
Basophils Relative: 1 %
Eosinophils Absolute: 0.1 10*3/uL (ref 0.0–0.5)
Eosinophils Relative: 2 %
HCT: 41.5 % (ref 36.0–46.0)
Hemoglobin: 14 g/dL (ref 12.0–15.0)
Immature Granulocytes: 0 %
Lymphocytes Relative: 38 %
Lymphs Abs: 2.5 10*3/uL (ref 0.7–4.0)
MCH: 31.4 pg (ref 26.0–34.0)
MCHC: 33.7 g/dL (ref 30.0–36.0)
MCV: 93 fL (ref 80.0–100.0)
Monocytes Absolute: 0.6 10*3/uL (ref 0.1–1.0)
Monocytes Relative: 9 %
Neutro Abs: 3.2 10*3/uL (ref 1.7–7.7)
Neutrophils Relative %: 50 %
Platelet Count: 353 10*3/uL (ref 150–400)
RBC: 4.46 MIL/uL (ref 3.87–5.11)
RDW: 11.3 % — ABNORMAL LOW (ref 11.5–15.5)
WBC Count: 6.5 10*3/uL (ref 4.0–10.5)
nRBC: 0 % (ref 0.0–0.2)

## 2022-03-12 LAB — CMP (CANCER CENTER ONLY)
ALT: 18 U/L (ref 0–44)
AST: 19 U/L (ref 15–41)
Albumin: 4.4 g/dL (ref 3.5–5.0)
Alkaline Phosphatase: 50 U/L (ref 38–126)
Anion gap: 5 (ref 5–15)
BUN: 9 mg/dL (ref 6–20)
CO2: 33 mmol/L — ABNORMAL HIGH (ref 22–32)
Calcium: 9.2 mg/dL (ref 8.9–10.3)
Chloride: 102 mmol/L (ref 98–111)
Creatinine: 1.07 mg/dL — ABNORMAL HIGH (ref 0.44–1.00)
GFR, Estimated: >60 mL/min (ref >60)
Glucose, Bld: 69 mg/dL — ABNORMAL LOW (ref 70–99)
Potassium: 4.3 mmol/L (ref 3.5–5.1)
Sodium: 140 mmol/L (ref 135–145)
Total Bilirubin: 0.9 mg/dL (ref 0.3–1.2)
Total Protein: 7.6 g/dL (ref 6.5–8.1)

## 2022-03-12 LAB — LACTATE DEHYDROGENASE: LDH: 160 U/L (ref 98–192)

## 2022-03-12 NOTE — Progress Notes (Signed)
[facility] [phone]   Fax:(336) [place], MD [address] [place] [address]  DIAGNOSIS: Essential thrombocythemia diagnosed in May 2019.  Positive for JAK 2 mutation.   PRIOR THERAPY: Hydroxyurea 500 mg daily.  The first dose was started on 01/01/2018.  Discontinued 12/11/2021   CURRENT THERAPY: Aspirin 325 mg p.o. daily started Dec 11, 2021  INTERVAL HISTORY: Darlene Davis 46 y.o. female returns to the clinic today for 3 months follow-up visit.  The patient is feeling fine today with no concerning complaints.  She denied having any chest pain, shortness of breath, cough or hemoptysis.  She has no nausea, vomiting, diarrhea or constipation.  She has no headache or visual changes.  She denied having any weight loss or night sweats.  She continues to tolerate her treatment with aspirin fairly well.  The patient is here today for evaluation and repeat blood work.  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  MEDICATIONS:  Current Outpatient Medications  Medication Sig Dispense Refill   Ascorbic Acid (VITAMIN C PO) Take by mouth.     aspirin EC 81 MG tablet Take 81 mg by mouth daily. Swallow whole.     Cholecalciferol (VITAMIN D PO) Take by mouth.     folic acid (FOLVITE) 170 MCG tablet Take 400 mcg by mouth 3 (three) times a week.     Multiple Vitamins-Iron (MULTIVITAMINS WITH IRON) TABS tablet Take 1 tablet by mouth daily.     prochlorperazine (COMPAZINE) 10 MG tablet Take 1 tablet (10 mg total) by mouth every 6 (six) hours as needed for nausea or vomiting. 30 tablet 0   SPRINTEC 28 0.25-35 MG-MCG  tablet Take 1 tablet by mouth daily.     valACYclovir (VALTREX) 1000 MG tablet Take by mouth.     No current facility-administered medications for this visit.    SURGICAL HISTORY:  Past Surgical History:  Procedure Laterality Date   NO PAST SURGERIES      REVIEW OF SYSTEMS:  A comprehensive review of systems was negative.   PHYSICAL EXAMINATION: General appearance: alert, cooperative, and no distress Head: Normocephalic, without obvious abnormality, atraumatic Neck: no adenopathy, no JVD, supple, symmetrical, trachea midline, and thyroid not enlarged, symmetric, no tenderness/mass/nodules Lymph nodes: Cervical, supraclavicular, and axillary nodes normal. Resp: clear to auscultation bilaterally Back: symmetric, no curvature. ROM normal. No CVA tenderness. Cardio: regular rate and rhythm, S1, S2 normal, no murmur, click, rub or gallop GI: soft, non-tender; bowel sounds normal; no masses,  no organomegaly Extremities: extremities normal, atraumatic, no cyanosis or edema  ECOG PERFORMANCE STATUS: 0 - Asymptomatic  Blood pressure 119/78, pulse 67, temperature 98.1 F (36.7 C), temperature source Oral, resp. rate 16, weight 130 lb 2 oz (59 kg), SpO2 100 %.  LABORATORY DATA: Lab Results  Component Value Date   WBC 6.5 03/12/2022   HGB 14.0 03/12/2022   HCT 41.5 03/12/2022   MCV 93.0 03/12/2022   PLT 353 03/12/2022      Chemistry      Component Value Date/Time   NA 139 12/11/2021 0751   K 4.0 12/11/2021 0751  CL 103 12/11/2021 0751   CO2 29 12/11/2021 0751   BUN 10 12/11/2021 0751   CREATININE 1.11 (H) 12/11/2021 0751      Component Value Date/Time   CALCIUM 9.2 12/11/2021 0751   ALKPHOS 57 12/11/2021 0751   AST 18 12/11/2021 0751   ALT 17 12/11/2021 0751   BILITOT 0.9 12/11/2021 0751       RADIOGRAPHIC STUDIES: No results found.  ASSESSMENT AND PLAN: This is a very pleasant 46 years old African-American female with essential thrombocythemia and was treated  initially with hydroxyurea 500 mg p.o. daily but now on Aspirin 325 mg po daily.  She is also on folic acid 161 mcg p.o. 3 times a week. The patient continues to tolerate her treatment with aspirin fairly well. Repeat CBC today showed no concerning findings and her platelet count are normal at 353,000. I recommended for the patient to continue her current treatment with aspirin and folic acid. I will see her back for follow-up visit in 3 months for evaluation with repeat blood work. She was advised to call immediately if she has any concerning symptoms in the interval The patient voices understanding of current disease status and treatment options and is in agreement with the current care plan. All questions were answered. The patient knows to call the clinic with any problems, questions or concerns. We can certainly see the patient much sooner if necessary.  Disclaimer: This note was dictated with voice recognition software. Similar sounding words can inadvertently be transcribed and may not be corrected upon review.

## 2022-05-23 ENCOUNTER — Telehealth: Payer: Self-pay | Admitting: Internal Medicine

## 2022-05-23 NOTE — Telephone Encounter (Signed)
Called patient regarding upcoming November appointments, patient is notified. 

## 2022-06-07 ENCOUNTER — Inpatient Hospital Stay: Payer: 59

## 2022-06-07 ENCOUNTER — Inpatient Hospital Stay: Payer: 59 | Attending: Internal Medicine | Admitting: Internal Medicine

## 2022-06-07 VITALS — BP 122/73 | HR 69 | Temp 97.6°F | Resp 15 | Wt 129.8 lb

## 2022-06-07 DIAGNOSIS — D473 Essential (hemorrhagic) thrombocythemia: Secondary | ICD-10-CM | POA: Diagnosis not present

## 2022-06-07 DIAGNOSIS — Z7982 Long term (current) use of aspirin: Secondary | ICD-10-CM | POA: Diagnosis not present

## 2022-06-07 LAB — CBC WITH DIFFERENTIAL (CANCER CENTER ONLY)
Abs Immature Granulocytes: 0.01 10*3/uL (ref 0.00–0.07)
Basophils Absolute: 0.1 10*3/uL (ref 0.0–0.1)
Basophils Relative: 1 %
Eosinophils Absolute: 0.2 10*3/uL (ref 0.0–0.5)
Eosinophils Relative: 3 %
HCT: 41.1 % (ref 36.0–46.0)
Hemoglobin: 13.7 g/dL (ref 12.0–15.0)
Immature Granulocytes: 0 %
Lymphocytes Relative: 36 %
Lymphs Abs: 2.4 10*3/uL (ref 0.7–4.0)
MCH: 30 pg (ref 26.0–34.0)
MCHC: 33.3 g/dL (ref 30.0–36.0)
MCV: 89.9 fL (ref 80.0–100.0)
Monocytes Absolute: 0.6 10*3/uL (ref 0.1–1.0)
Monocytes Relative: 9 %
Neutro Abs: 3.4 10*3/uL (ref 1.7–7.7)
Neutrophils Relative %: 51 %
Platelet Count: 384 10*3/uL (ref 150–400)
RBC: 4.57 MIL/uL (ref 3.87–5.11)
RDW: 12.4 % (ref 11.5–15.5)
WBC Count: 6.7 10*3/uL (ref 4.0–10.5)
nRBC: 0 % (ref 0.0–0.2)

## 2022-06-07 LAB — CMP (CANCER CENTER ONLY)
ALT: 17 U/L (ref 0–44)
AST: 20 U/L (ref 15–41)
Albumin: 4.2 g/dL (ref 3.5–5.0)
Alkaline Phosphatase: 52 U/L (ref 38–126)
Anion gap: 4 — ABNORMAL LOW (ref 5–15)
BUN: 6 mg/dL (ref 6–20)
CO2: 31 mmol/L (ref 22–32)
Calcium: 9.2 mg/dL (ref 8.9–10.3)
Chloride: 104 mmol/L (ref 98–111)
Creatinine: 1.1 mg/dL — ABNORMAL HIGH (ref 0.44–1.00)
GFR, Estimated: >60 mL/min (ref >60)
Glucose, Bld: 72 mg/dL (ref 70–99)
Potassium: 4.1 mmol/L (ref 3.5–5.1)
Sodium: 139 mmol/L (ref 135–145)
Total Bilirubin: 1.3 mg/dL — ABNORMAL HIGH (ref 0.3–1.2)
Total Protein: 7.5 g/dL (ref 6.5–8.1)

## 2022-06-07 LAB — LACTATE DEHYDROGENASE: LDH: 140 U/L (ref 98–192)

## 2022-06-07 NOTE — Progress Notes (Signed)
[facility] [phone]   Fax:(336) [place], MD [address] [place] [address]  DIAGNOSIS: Essential thrombocythemia diagnosed in May 2019.  Positive for JAK 2 mutation.   PRIOR THERAPY: Hydroxyurea 500 mg daily.  The first dose was started on 01/01/2018.  Discontinued 12/11/2021   CURRENT THERAPY: Aspirin 325 mg p.o. daily started Dec 11, 2021  INTERVAL HISTORY: Darlene Davis 46 y.o. female returns to the clinic today for follow-up visit.  The patient is feeling fine today with no concerning complaints.  She has no fatigue or weakness.  She has no bleeding, bruises or ecchymosis.  She has no nausea, vomiting, diarrhea or constipation.  She has no chest pain, shortness of breath, cough or hemoptysis.  She continues to tolerate her treatment with daily aspirin fairly well.  She is here today for evaluation and repeat blood work.  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  MEDICATIONS:  Current Outpatient Medications  Medication Sig Dispense Refill   Ascorbic Acid (VITAMIN C PO) Take by mouth.     aspirin EC 81 MG tablet Take 81 mg by mouth daily. Swallow whole.     Cholecalciferol (VITAMIN D PO) Take by mouth.     folic acid (FOLVITE) 353 MCG tablet Take 400 mcg by mouth 3 (three) times a week.     Multiple Vitamins-Iron (MULTIVITAMINS WITH IRON) TABS tablet Take 1 tablet by mouth daily.     prochlorperazine (COMPAZINE) 10 MG tablet Take 1 tablet (10 mg total) by mouth every 6 (six) hours as needed for nausea or vomiting. 30 tablet 0   SPRINTEC 28 0.25-35 MG-MCG tablet Take 1 tablet by mouth daily.      valACYclovir (VALTREX) 1000 MG tablet Take by mouth.     No current facility-administered medications for this visit.    SURGICAL HISTORY:  Past Surgical History:  Procedure Laterality Date   NO PAST SURGERIES      REVIEW OF SYSTEMS:  A comprehensive review of systems was negative.   PHYSICAL EXAMINATION: General appearance: alert, cooperative, and no distress Head: Normocephalic, without obvious abnormality, atraumatic Neck: no adenopathy, no JVD, supple, symmetrical, trachea midline, and thyroid not enlarged, symmetric, no tenderness/mass/nodules Lymph nodes: Cervical, supraclavicular, and axillary nodes normal. Resp: clear to auscultation bilaterally Back: symmetric, no curvature. ROM normal. No CVA tenderness. Cardio: regular rate and rhythm, S1, S2 normal, no murmur, click, rub or gallop GI: soft, non-tender; bowel sounds normal; no masses,  no organomegaly Extremities: extremities normal, atraumatic, no cyanosis or edema  ECOG PERFORMANCE STATUS: 0 - Asymptomatic  Blood pressure 122/73, pulse 69, temperature 97.6 F (36.4 C), resp. rate 15, weight 129 lb 12.8 oz (58.9 kg), SpO2 100 %.  LABORATORY DATA: Lab Results  Component Value Date   WBC 6.7 06/07/2022   HGB 13.7 06/07/2022   HCT 41.1 06/07/2022   MCV 89.9 06/07/2022   PLT 384 06/07/2022      Chemistry      Component Value Date/Time   NA 140 03/12/2022 0745   K 4.3 03/12/2022 0745   CL 102 03/12/2022 0745   CO2 33 (  H) 03/12/2022 0745   BUN 9 03/12/2022 0745   CREATININE 1.07 (H) 03/12/2022 0745      Component Value Date/Time   CALCIUM 9.2 03/12/2022 0745   ALKPHOS 50 03/12/2022 0745   AST 19 03/12/2022 0745   ALT 18 03/12/2022 0745   BILITOT 0.9 03/12/2022 0745       RADIOGRAPHIC STUDIES: No results found.  ASSESSMENT AND PLAN: This is a very pleasant 46 years old African-American female with essential thrombocythemia and was treated initially with hydroxyurea 500 mg p.o. daily but now on  Aspirin 325 mg po daily.   The patient has been tolerating her treatment with aspirin fairly well. Repeat CBC today was unremarkable for any abnormality. I recommended for her to continue her current treatment with aspirin and I will see her back for follow-up visit in 6 months for evaluation with repeat CBC, comprehensive metabolic panel and LDH. She was advised to call immediately if she has any other concerning symptoms in the interval. The patient voices understanding of current disease status and treatment options and is in agreement with the current care plan. All questions were answered. The patient knows to call the clinic with any problems, questions or concerns. We can certainly see the patient much sooner if necessary.  Disclaimer: This note was dictated with voice recognition software. Similar sounding words can inadvertently be transcribed and may not be corrected upon review.

## 2022-06-11 ENCOUNTER — Ambulatory Visit: Payer: 59 | Admitting: Internal Medicine

## 2022-06-11 ENCOUNTER — Other Ambulatory Visit: Payer: 59

## 2022-06-12 ENCOUNTER — Ambulatory Visit: Payer: 59 | Admitting: Internal Medicine

## 2022-06-12 ENCOUNTER — Other Ambulatory Visit: Payer: 59

## 2022-11-01 ENCOUNTER — Encounter: Payer: Self-pay | Admitting: Internal Medicine

## 2022-11-02 LAB — HM PAP SMEAR
CHL HPV: NEGATIVE
HM Pap smear: NEGATIVE

## 2022-11-07 ENCOUNTER — Encounter: Payer: Self-pay | Admitting: Internal Medicine

## 2022-12-05 ENCOUNTER — Inpatient Hospital Stay: Payer: 59 | Attending: Internal Medicine

## 2022-12-05 ENCOUNTER — Inpatient Hospital Stay (HOSPITAL_BASED_OUTPATIENT_CLINIC_OR_DEPARTMENT_OTHER): Payer: 59 | Admitting: Internal Medicine

## 2022-12-05 VITALS — BP 123/84 | HR 86 | Temp 98.2°F | Resp 14 | Ht 62.0 in | Wt 127.8 lb

## 2022-12-05 DIAGNOSIS — C3490 Malignant neoplasm of unspecified part of unspecified bronchus or lung: Secondary | ICD-10-CM | POA: Diagnosis not present

## 2022-12-05 DIAGNOSIS — D473 Essential (hemorrhagic) thrombocythemia: Secondary | ICD-10-CM | POA: Diagnosis present

## 2022-12-05 DIAGNOSIS — Z7982 Long term (current) use of aspirin: Secondary | ICD-10-CM | POA: Insufficient documentation

## 2022-12-05 LAB — CBC WITH DIFFERENTIAL (CANCER CENTER ONLY)
Abs Immature Granulocytes: 0.01 10*3/uL (ref 0.00–0.07)
Basophils Absolute: 0.1 10*3/uL (ref 0.0–0.1)
Basophils Relative: 1 %
Eosinophils Absolute: 0.1 10*3/uL (ref 0.0–0.5)
Eosinophils Relative: 2 %
HCT: 43.3 % (ref 36.0–46.0)
Hemoglobin: 14.5 g/dL (ref 12.0–15.0)
Immature Granulocytes: 0 %
Lymphocytes Relative: 36 %
Lymphs Abs: 2.7 10*3/uL (ref 0.7–4.0)
MCH: 29.5 pg (ref 26.0–34.0)
MCHC: 33.5 g/dL (ref 30.0–36.0)
MCV: 88 fL (ref 80.0–100.0)
Monocytes Absolute: 0.6 10*3/uL (ref 0.1–1.0)
Monocytes Relative: 8 %
Neutro Abs: 3.9 10*3/uL (ref 1.7–7.7)
Neutrophils Relative %: 53 %
Platelet Count: 523 10*3/uL — ABNORMAL HIGH (ref 150–400)
RBC: 4.92 MIL/uL (ref 3.87–5.11)
RDW: 13.2 % (ref 11.5–15.5)
WBC Count: 7.4 10*3/uL (ref 4.0–10.5)
nRBC: 0 % (ref 0.0–0.2)

## 2022-12-05 LAB — CMP (CANCER CENTER ONLY)
ALT: 17 U/L (ref 0–44)
AST: 18 U/L (ref 15–41)
Albumin: 4.3 g/dL (ref 3.5–5.0)
Alkaline Phosphatase: 48 U/L (ref 38–126)
Anion gap: 7 (ref 5–15)
BUN: 8 mg/dL (ref 6–20)
CO2: 30 mmol/L (ref 22–32)
Calcium: 9 mg/dL (ref 8.9–10.3)
Chloride: 104 mmol/L (ref 98–111)
Creatinine: 1.05 mg/dL — ABNORMAL HIGH (ref 0.44–1.00)
GFR, Estimated: >60 mL/min (ref >60)
Glucose, Bld: 68 mg/dL — ABNORMAL LOW (ref 70–99)
Potassium: 4 mmol/L (ref 3.5–5.1)
Sodium: 141 mmol/L (ref 135–145)
Total Bilirubin: 0.9 mg/dL (ref 0.3–1.2)
Total Protein: 7.3 g/dL (ref 6.5–8.1)

## 2022-12-05 LAB — LACTATE DEHYDROGENASE: LDH: 141 U/L (ref 98–192)

## 2022-12-05 NOTE — Progress Notes (Signed)
[facility] [phone]   Fax:(336) [phone]  OFFICE PROGRESS NOTE  Darlene Plump, MD [address] [place] [address]  DIAGNOSIS: Essential thrombocythemia diagnosed in May 2019.  Positive for JAK 2 mutation.   PRIOR THERAPY: Hydroxyurea 500 mg daily.  The first dose was started on 01/01/2018.  Discontinued 12/11/2021   CURRENT THERAPY: Aspirin 325 mg p.o. daily started Dec 11, 2021  INTERVAL HISTORY: Darlene Davis 47 y.o. female returns to the clinic today for 6 months follow-up visit.  The patient is feeling fine today with no concerning complaints.  She denied having any current chest pain, shortness of breath, cough or hemoptysis.  She has no nausea, vomiting, diarrhea or constipation.  She has no fatigue or weakness.  She has no bleeding, bruises or ecchymosis.  She has been tolerating her treatment with aspirin fairly well.  She is here today for evaluation and repeat blood work.  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  MEDICATIONS:  Current Outpatient Medications  Medication Sig Dispense Refill   Ascorbic Acid (VITAMIN C PO) Take by mouth.     aspirin EC 81 MG tablet Take 81 mg by mouth daily. Swallow whole.     Cholecalciferol (VITAMIN D PO) Take by mouth.     folic acid (FOLVITE) 400 MCG tablet Take 400 mcg by mouth 3 (three) times a week.     Multiple Vitamins-Iron (MULTIVITAMINS WITH IRON) TABS tablet Take 1 tablet by mouth daily.     prochlorperazine (COMPAZINE) 10 MG tablet Take 1 tablet (10 mg total) by mouth every 6 (six) hours as needed for nausea or vomiting. 30 tablet 0   SPRINTEC 28 0.25-35 MG-MCG tablet Take 1  tablet by mouth daily.     valACYclovir (VALTREX) 1000 MG tablet Take by mouth.     No current facility-administered medications for this visit.    SURGICAL HISTORY:  Past Surgical History:  Procedure Laterality Date   NO PAST SURGERIES      REVIEW OF SYSTEMS:  A comprehensive review of systems was negative.   PHYSICAL EXAMINATION: General appearance: alert, cooperative, and no distress Head: Normocephalic, without obvious abnormality, atraumatic Neck: no adenopathy, no JVD, supple, symmetrical, trachea midline, and thyroid not enlarged, symmetric, no tenderness/mass/nodules Lymph nodes: Cervical, supraclavicular, and axillary nodes normal. Resp: clear to auscultation bilaterally Back: symmetric, no curvature. ROM normal. No CVA tenderness. Cardio: regular rate and rhythm, S1, S2 normal, no murmur, click, rub or gallop GI: soft, non-tender; bowel sounds normal; no masses,  no organomegaly Extremities: extremities normal, atraumatic, no cyanosis or edema  ECOG PERFORMANCE STATUS: 0 - Asymptomatic  Blood pressure 122/73, pulse 69, temperature 97.6 F (36.4 C), resp. rate 15, weight 129 lb 12.8 oz (58.9 kg), SpO2 100 %.  LABORATORY DATA: Lab Results  Component Value Date   WBC 7.4 12/05/2022   HGB 14.5 12/05/2022   HCT 43.3 12/05/2022   MCV 88.0 12/05/2022   PLT 523 (H) 12/05/2022      Chemistry      Component Value Date/Time   NA 140 03/12/2022 0745   K 4.3 03/12/2022 0745   CL 102 03/12/2022 0745  CO2 33 (H) 03/12/2022 0745   BUN 9 03/12/2022 0745   CREATININE 1.07 (H) 03/12/2022 0745      Component Value Date/Time   CALCIUM 9.2 03/12/2022 0745   ALKPHOS 50 03/12/2022 0745   AST 19 03/12/2022 0745   ALT 18 03/12/2022 0745   BILITOT 0.9 03/12/2022 0745       RADIOGRAPHIC STUDIES: No results found.  ASSESSMENT AND PLAN: This is a very pleasant 47 years old African-American female with essential thrombocythemia and was treated initially with hydroxyurea 500  mg p.o. daily but now on Aspirin 325 mg po daily.   The patient has been tolerating this treatment well with no concerning adverse effects. She had repeat blood work performed earlier today.  Her CBC showed elevated platelets count up to 523,000. I recommended for the patient to continue her current treatment with aspirin but she will take hydroxyurea 500 mg p.o. twice a week on Mondays and Thursdays. I will see her back for follow-up visit in 3 months for evaluation and repeat blood work. She was advised to call immediately if she has any concerning symptoms in the interval. The patient voices understanding of current disease status and treatment options and is in agreement with the current care plan. All questions were answered. The patient knows to call the clinic with any problems, questions or concerns. We can certainly see the patient much sooner if necessary.  Disclaimer: This note was dictated with voice recognition software. Similar sounding words can inadvertently be transcribed and may not be corrected upon review.

## 2022-12-18 LAB — HM MAMMOGRAPHY

## 2022-12-19 ENCOUNTER — Encounter: Payer: Self-pay | Admitting: Internal Medicine

## 2023-01-30 ENCOUNTER — Encounter: Payer: Self-pay | Admitting: Internal Medicine

## 2023-01-30 ENCOUNTER — Ambulatory Visit (INDEPENDENT_AMBULATORY_CARE_PROVIDER_SITE_OTHER): Payer: 59 | Admitting: Internal Medicine

## 2023-01-30 VITALS — BP 116/64 | HR 78 | Temp 98.1°F | Resp 16 | Ht 62.0 in | Wt 123.0 lb

## 2023-01-30 DIAGNOSIS — Z0000 Encounter for general adult medical examination without abnormal findings: Secondary | ICD-10-CM

## 2023-01-30 LAB — LIPID PANEL
Cholesterol: 202 mg/dL — ABNORMAL HIGH (ref 0–200)
HDL: 80.6 mg/dL (ref >39.00)
LDL Cholesterol: 102 mg/dL — ABNORMAL HIGH (ref 0–99)
NonHDL: 121.01
Total CHOL/HDL Ratio: 3
Triglycerides: 94 mg/dL (ref 0.0–149.0)
VLDL: 18.8 mg/dL (ref 0.0–40.0)

## 2023-01-30 LAB — TSH: TSH: 1.03 u[IU]/mL (ref 0.35–5.50)

## 2023-01-30 NOTE — Progress Notes (Unsigned)
   Subjective:    Patient ID: Darlene Davis, female    DOB: Mar 18, 1976, 47 y.o.   MRN: [id number]  DOS:  01/30/2023 Type of visit - description: Here for CPX. Feels well, has no concerns.     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   Cholecalciferol (VITAMIN D PO) Oral   folic acid (FOLVITE) 400 mcg, Oral, 3 times weekly   Multiple Vitamins-Iron (MULTIVITAMINS WITH IRON) TABS tablet 1 tablet, Oral, Daily   prochlorperazine (COMPAZINE) 10 mg, Oral, Every 6 hours PRN   SPRINTEC 28 0.25-35 MG-MCG tablet 1 tablet, Oral, Daily   valACYclovir (VALTREX) 1000 MG tablet Oral       Objective:   Physical Exam BP 116/64   Pulse 78   Temp 98.1 F (36.7 C) (Oral)   Resp 16   Ht 5\' 2"  (1.575 m)   Wt 123 lb (55.8 kg)   LMP 12/05/2022 (Approximate)   SpO2 97%   BMI 22.50 kg/m  General: Well developed, NAD, BMI noted Neck: No  thyromegaly  HEENT:  Normocephalic . Face symmetric, atraumatic Lungs:  CTA B Normal respiratory effort, no intercostal retractions, no accessory muscle use. Heart: RRR,  no murmur.  Abdomen:  Not distended, soft, non-tender. No rebound or rigidity.   Lower extremities: no pretibial edema bilaterally  Skin: Exposed areas without rash. Not pale. Not jaundice Neurologic:  alert & oriented X3.  Speech normal, gait appropriate for age and unassisted Strength symmetric and appropriate for age.  Psych: Cognition and judgment appear intact.  Cooperative with  normal attention span and concentration.  Behavior appropriate. No anxious or depressed appearing.     Assessment     Assessment Essential thrombocythemia, sees hem  PLAN Here for CPX  Td -2021  Vaccines I rec: flu shot, covid booster  Female Care: per gyn, PAP 10/2022 ([facility]);  [facility] 12/2022 [facility]: Negative colonoscopy December 2022, next 10 years per C-scope report Available labs reviewed, will get FLP and TSH. Diet: Doing well, exercise: Recommend 3 hours of cardiovascular exercise weekly. RTC 1 year

## 2023-01-30 NOTE — Patient Instructions (Addendum)
Vaccines I recommend:  Covid booster Flu shot this fall    GO TO THE LAB : Get the blood work     GO TO THE FRONT DESK, PLEASE SCHEDULE YOUR APPOINTMENTS Come back for   a physical exam in 1 year

## 2023-01-31 ENCOUNTER — Encounter: Payer: Self-pay | Admitting: Internal Medicine

## 2023-01-31 NOTE — Assessment & Plan Note (Signed)
Here for CPX Td -2021  Vaccines I rec: flu shot, covid booster  Female Care: per gyn, PAP 10/2022 ([facility]);  MMG 12/2022 [facility]: Negative colonoscopy December 2022, next 10 years per C-scope report Available labs reviewed, will get FLP and TSH. Diet: Doing well, exercise: Recommend 3 hours of cardiovascular exercise weekly. RTC 1 year

## 2023-03-07 ENCOUNTER — Inpatient Hospital Stay (HOSPITAL_BASED_OUTPATIENT_CLINIC_OR_DEPARTMENT_OTHER): Payer: 59 | Admitting: Internal Medicine

## 2023-03-07 ENCOUNTER — Other Ambulatory Visit: Payer: Self-pay

## 2023-03-07 ENCOUNTER — Inpatient Hospital Stay: Payer: 59 | Attending: Internal Medicine

## 2023-03-07 VITALS — BP 120/78 | HR 80 | Temp 97.7°F | Resp 16 | Ht 62.0 in | Wt 124.8 lb

## 2023-03-07 DIAGNOSIS — C3490 Malignant neoplasm of unspecified part of unspecified bronchus or lung: Secondary | ICD-10-CM

## 2023-03-07 DIAGNOSIS — D473 Essential (hemorrhagic) thrombocythemia: Secondary | ICD-10-CM

## 2023-03-07 LAB — CBC WITH DIFFERENTIAL (CANCER CENTER ONLY)
Abs Immature Granulocytes: 0.01 10*3/uL (ref 0.00–0.07)
Basophils Absolute: 0.1 10*3/uL (ref 0.0–0.1)
Basophils Relative: 1 %
Eosinophils Absolute: 0.1 10*3/uL (ref 0.0–0.5)
Eosinophils Relative: 2 %
HCT: 40.7 % (ref 36.0–46.0)
Hemoglobin: 13.9 g/dL (ref 12.0–15.0)
Immature Granulocytes: 0 %
Lymphocytes Relative: 38 %
Lymphs Abs: 2.4 10*3/uL (ref 0.7–4.0)
MCH: 30.3 pg (ref 26.0–34.0)
MCHC: 34.2 g/dL (ref 30.0–36.0)
MCV: 88.9 fL (ref 80.0–100.0)
Monocytes Absolute: 0.6 10*3/uL (ref 0.1–1.0)
Monocytes Relative: 10 %
Neutro Abs: 3.1 10*3/uL (ref 1.7–7.7)
Neutrophils Relative %: 49 %
Platelet Count: 381 10*3/uL (ref 150–400)
RBC: 4.58 MIL/uL (ref 3.87–5.11)
RDW: 13.9 % (ref 11.5–15.5)
WBC Count: 6.3 10*3/uL (ref 4.0–10.5)
nRBC: 0 % (ref 0.0–0.2)

## 2023-03-07 LAB — CMP (CANCER CENTER ONLY)
ALT: 17 U/L (ref 0–44)
AST: 23 U/L (ref 15–41)
Albumin: 4.3 g/dL (ref 3.5–5.0)
Alkaline Phosphatase: 52 U/L (ref 38–126)
Anion gap: 6 (ref 5–15)
BUN: 11 mg/dL (ref 6–20)
CO2: 32 mmol/L (ref 22–32)
Calcium: 9.3 mg/dL (ref 8.9–10.3)
Chloride: 103 mmol/L (ref 98–111)
Creatinine: 1.05 mg/dL — ABNORMAL HIGH (ref 0.44–1.00)
GFR, Estimated: >60 mL/min (ref >60)
Glucose, Bld: 66 mg/dL — ABNORMAL LOW (ref 70–99)
Potassium: 4.5 mmol/L (ref 3.5–5.1)
Sodium: 141 mmol/L (ref 135–145)
Total Bilirubin: 0.8 mg/dL (ref 0.3–1.2)
Total Protein: 7.4 g/dL (ref 6.5–8.1)

## 2023-03-07 LAB — LACTATE DEHYDROGENASE: LDH: 146 U/L (ref 98–192)

## 2023-03-07 MED ORDER — HYDROXYUREA 500 MG PO CAPS: ORAL_CAPSULE | ORAL | 1 refills | Status: DC

## 2023-03-07 NOTE — Progress Notes (Signed)
[facility] Telephone:([phone]   Fax:([phone]  OFFICE PROGRESS NOTE  [name], MD [address] [place] [address]  DIAGNOSIS: Essential thrombocythemia diagnosed in May 2019.  Positive for JAK 2 mutation.   PRIOR THERAPY: Hydroxyurea 500 mg daily.  The first dose was started on 01/01/2018.  Discontinued 12/11/2021   CURRENT THERAPY: Aspirin 325 mg p.o. daily started Dec 11, 2021 in addition to hydroxyurea 500 mg p.o. on Monday and Thursday every week  INTERVAL HISTORY: Darlene Davis 47 y.o. female returns to the clinic today for follow-up visit.  The patient is feeling fine today with no concerning complaints.  She denied having any current chest pain, shortness of breath, cough or hemoptysis.  She has no nausea, vomiting, diarrhea or constipation.  She has no headache or visual changes.  She denied having any recent weight loss or night sweats.  She continues to tolerate her treatment with aspirin and hydroxyurea fairly well.  She is here today for evaluation and repeat blood work.  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  MEDICATIONS:  Current Outpatient Medications  Medication Sig Dispense Refill   Ascorbic Acid (VITAMIN C PO) Take by mouth.     aspirin EC 81 MG tablet Take 81 mg by mouth daily. Swallow whole.     Cholecalciferol (VITAMIN D PO) Take by mouth.     Multiple Vitamins-Iron (MULTIVITAMINS WITH IRON) TABS tablet Take 1 tablet by mouth daily.     SPRINTEC 28 0.25-35 MG-MCG tablet Take 1 tablet by mouth daily.     valACYclovir (VALTREX) 1000 MG tablet Take by mouth.     No current facility-administered medications for  this visit.    SURGICAL HISTORY:  Past Surgical History:  Procedure Laterality Date   NO PAST SURGERIES      REVIEW OF SYSTEMS:  A comprehensive review of systems was negative.   PHYSICAL EXAMINATION: General appearance: alert, cooperative, and no distress Head: Normocephalic, without obvious abnormality, atraumatic Neck: no adenopathy, no JVD, supple, symmetrical, trachea midline, and thyroid not enlarged, symmetric, no tenderness/mass/nodules Lymph nodes: Cervical, supraclavicular, and axillary nodes normal. Resp: clear to auscultation bilaterally Back: symmetric, no curvature. ROM normal. No CVA tenderness. Cardio: regular rate and rhythm, S1, S2 normal, no murmur, click, rub or gallop GI: soft, non-tender; bowel sounds normal; no masses,  no organomegaly Extremities: extremities normal, atraumatic, no cyanosis or edema  ECOG PERFORMANCE STATUS: 0 - Asymptomatic  Blood pressure 120/78, pulse 80, temperature 97.7 F (36.5 C), temperature source Oral, resp. rate 16, height 5\' 2"  (1.575 m), weight 124 lb 12.8 oz (56.6 kg), SpO2 100%.  LABORATORY DATA: Lab Results  Component Value Date   WBC 6.3 03/07/2023   HGB 13.9 03/07/2023   HCT 40.7 03/07/2023   MCV 88.9 03/07/2023   PLT 381 03/07/2023      Chemistry      Component Value Date/Time   NA 141 12/05/2022 0754   K 4.0 12/05/2022 0754   CL 104 12/05/2022 0754   CO2 30 12/05/2022 0754   BUN 8 12/05/2022 0754   CREATININE 1.05 (H) 12/05/2022 0754      Component  Value Date/Time   CALCIUM 9.0 12/05/2022 0754   ALKPHOS 48 12/05/2022 0754   AST 18 12/05/2022 0754   ALT 17 12/05/2022 0754   BILITOT 0.9 12/05/2022 0754       RADIOGRAPHIC STUDIES: No results found.  ASSESSMENT AND PLAN: This is a very pleasant 47 years old African-American female with essential thrombocythemia and was treated initially with hydroxyurea 500 mg p.o. daily but now on Aspirin 325 mg po daily.   She is currently on hydroxyurea 500 mg only  on Mondays and Thursdays every week in addition to the daily aspirin.  She is tolerating this treatment well. Repeat CBC today was unremarkable for any abnormality. I recommended for the patient to continue her treatment with the same regimen for now. I will see her back for follow-up visit in 3 months for evaluation and repeat blood work. She was advised to call immediately if she has any other concerning symptoms in the interval. The patient voices understanding of current disease status and treatment options and is in agreement with the current care plan. All questions were answered. The patient knows to call the clinic with any problems, questions or concerns. We can certainly see the patient much sooner if necessary.  Disclaimer: This note was dictated with voice recognition software. Similar sounding words can inadvertently be transcribed and may not be corrected upon review.

## 2023-07-30 ENCOUNTER — Ambulatory Visit: Payer: 59 | Admitting: Internal Medicine

## 2023-07-30 ENCOUNTER — Other Ambulatory Visit: Payer: 59

## 2023-08-01 ENCOUNTER — Other Ambulatory Visit: Payer: Self-pay

## 2023-08-01 DIAGNOSIS — D473 Essential (hemorrhagic) thrombocythemia: Secondary | ICD-10-CM

## 2023-08-05 ENCOUNTER — Inpatient Hospital Stay: Payer: 59 | Attending: Internal Medicine

## 2023-08-05 ENCOUNTER — Inpatient Hospital Stay (HOSPITAL_BASED_OUTPATIENT_CLINIC_OR_DEPARTMENT_OTHER): Payer: 59 | Admitting: Internal Medicine

## 2023-08-05 VITALS — BP 123/78 | HR 77 | Temp 98.4°F | Resp 16 | Ht 62.0 in | Wt 126.1 lb

## 2023-08-05 DIAGNOSIS — Z7964 Long term (current) use of myelosuppressive agent: Secondary | ICD-10-CM | POA: Diagnosis not present

## 2023-08-05 DIAGNOSIS — D693 Immune thrombocytopenic purpura: Secondary | ICD-10-CM | POA: Insufficient documentation

## 2023-08-05 DIAGNOSIS — D473 Essential (hemorrhagic) thrombocythemia: Secondary | ICD-10-CM | POA: Diagnosis not present

## 2023-08-05 LAB — CBC WITH DIFFERENTIAL (CANCER CENTER ONLY)
Abs Immature Granulocytes: 0.01 10*3/uL (ref 0.00–0.07)
Basophils Absolute: 0 10*3/uL (ref 0.0–0.1)
Basophils Relative: 1 %
Eosinophils Absolute: 0.1 10*3/uL (ref 0.0–0.5)
Eosinophils Relative: 1 %
HCT: 37.5 % (ref 36.0–46.0)
Hemoglobin: 12.8 g/dL (ref 12.0–15.0)
Immature Granulocytes: 0 %
Lymphocytes Relative: 34 %
Lymphs Abs: 2.4 10*3/uL (ref 0.7–4.0)
MCH: 30.5 pg (ref 26.0–34.0)
MCHC: 34.1 g/dL (ref 30.0–36.0)
MCV: 89.5 fL (ref 80.0–100.0)
Monocytes Absolute: 0.6 10*3/uL (ref 0.1–1.0)
Monocytes Relative: 9 %
Neutro Abs: 3.8 10*3/uL (ref 1.7–7.7)
Neutrophils Relative %: 55 %
Platelet Count: 365 10*3/uL (ref 150–400)
RBC: 4.19 MIL/uL (ref 3.87–5.11)
RDW: 12.7 % (ref 11.5–15.5)
WBC Count: 6.9 10*3/uL (ref 4.0–10.5)
nRBC: 0 % (ref 0.0–0.2)

## 2023-08-05 LAB — CMP (CANCER CENTER ONLY)
ALT: 18 U/L (ref 0–44)
AST: 21 U/L (ref 15–41)
Albumin: 4.1 g/dL (ref 3.5–5.0)
Alkaline Phosphatase: 51 U/L (ref 38–126)
Anion gap: 6 (ref 5–15)
BUN: 11 mg/dL (ref 6–20)
CO2: 31 mmol/L (ref 22–32)
Calcium: 9.2 mg/dL (ref 8.9–10.3)
Chloride: 101 mmol/L (ref 98–111)
Creatinine: 0.98 mg/dL (ref 0.44–1.00)
GFR, Estimated: >60 mL/min (ref >60)
Glucose, Bld: 85 mg/dL (ref 70–99)
Potassium: 4 mmol/L (ref 3.5–5.1)
Sodium: 138 mmol/L (ref 135–145)
Total Bilirubin: 0.8 mg/dL (ref 0.0–1.2)
Total Protein: 7 g/dL (ref 6.5–8.1)

## 2023-08-05 LAB — LACTATE DEHYDROGENASE: LDH: 147 U/L (ref 98–192)

## 2023-08-05 NOTE — Progress Notes (Signed)
[facility] [phone]   Fax:([phone]  OFFICE PROGRESS NOTE  [name], MD [address] [place] [address]  DIAGNOSIS: Essential thrombocythemia diagnosed in May 2019.  Positive for JAK 2 mutation.   PRIOR THERAPY: Hydroxyurea 500 mg daily.  The first dose was started on 01/01/2018.  Discontinued 12/11/2021   CURRENT THERAPY: Aspirin 325 mg p.o. daily started Dec 11, 2021 in addition to hydroxyurea 500 mg p.o. on Monday and Thursday every week  INTERVAL HISTORY: Darlene Davis 47 y.o. female returns to the clinic today for follow-up visit.Discussed the use of AI scribe software for clinical note transcription with the patient, who gave verbal consent to proceed.  History of Present Illness   The patient, a 47 year old diagnosed with essential thrombocytopenia and a positive JAK2 mutation in May 2019, initially managed with hydroxyurea for a few years. Due to her young age, a trial of aspirin monotherapy was attempted, but platelet counts began to rise. Hydroxyurea was reintroduced at a dose of 500mg  on Mondays and Thursdays. The patient reports tolerating this regimen well, with no associated pains, aches, bleeding, bruising, nausea, vomiting, or diarrhea.  Over the past six months, the patient's platelet counts have been well controlled on this regimen, with the most recent count at 365,000. Hemoglobin, hematocrit, and white blood cell counts are also within normal limits.  The patient maintains an active lifestyle, working in retail and regularly attending the gym. No new symptoms or concerns were reported.       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  MEDICATIONS:  Current Outpatient Medications  Medication Sig Dispense Refill   Ascorbic Acid (VITAMIN C PO) Take by mouth.     aspirin EC 81 MG tablet Take 81 mg by mouth daily. Swallow whole.     Cholecalciferol (VITAMIN D PO) Take by mouth.     hydroxyurea (HYDREA) 500 MG capsule 1 tablet p.o. on Mondays and Thursdays.  May take with food to minimize GI side effects. 90 capsule 1   Multiple Vitamins-Iron (MULTIVITAMINS WITH IRON) TABS tablet Take 1 tablet by mouth daily.     SPRINTEC 28 0.25-35 MG-MCG tablet Take 1 tablet by mouth daily.     valACYclovir (VALTREX) 1000 MG tablet Take by mouth.     No current facility-administered medications for this visit.    SURGICAL HISTORY:  Past Surgical History:  Procedure Laterality Date   NO PAST SURGERIES      REVIEW OF SYSTEMS:  A comprehensive review of systems was negative.   PHYSICAL EXAMINATION: General appearance: alert, cooperative, and no distress Head: Normocephalic, without obvious abnormality, atraumatic Neck: no adenopathy, no JVD, supple, symmetrical, trachea midline, and thyroid not enlarged, symmetric, no tenderness/mass/nodules Lymph nodes: Cervical, supraclavicular, and axillary nodes normal. Resp: clear to auscultation bilaterally Back: symmetric, no curvature. ROM normal. No CVA tenderness. Cardio: regular rate and rhythm, S1, S2 normal, no murmur, click, rub or gallop GI: soft, non-tender; bowel sounds normal; no masses,  no organomegaly Extremities: extremities normal, atraumatic, no cyanosis or edema  ECOG PERFORMANCE STATUS: 0 - Asymptomatic  Blood pressure 123/78, pulse 77, temperature 98.4 F (36.9 C),  temperature source Temporal, resp. rate 16, height 5\' 2"  (1.575 m), weight 126 lb 1.6 oz (57.2 kg), SpO2 100%.  LABORATORY DATA: Lab Results  Component Value Date   WBC 6.9 08/05/2023   HGB 12.8 08/05/2023   HCT 37.5 08/05/2023   MCV 89.5 08/05/2023   PLT 365  08/05/2023      Chemistry      Component Value Date/Time   NA 141 03/07/2023 0813   K 4.5 03/07/2023 0813   CL 103 03/07/2023 0813   CO2 32 03/07/2023 0813   BUN 11 03/07/2023 0813   CREATININE 1.05 (H) 03/07/2023 0813      Component Value Date/Time   CALCIUM 9.3 03/07/2023 0813   ALKPHOS 52 03/07/2023 0813   AST 23 03/07/2023 0813   ALT 17 03/07/2023 0813   BILITOT 0.8 03/07/2023 0813       RADIOGRAPHIC STUDIES: No results found.  ASSESSMENT AND PLAN: This is a very pleasant 47 years old African-American female with essential thrombocythemia and was treated initially with hydroxyurea 500 mg p.o. daily but now on Aspirin 325 mg po daily.   She is currently on hydroxyurea 500 mg only on Mondays and Thursdays every week in addition to the daily aspirin.  She is tolerating this treatment well.    Essential Thrombocythemia Diagnosed in May 2019 with essential thrombocythemia and a positive JAK2 mutation. Initially managed with hydroxyurea, transitioned to aspirin monotherapy due to young age, but reintroduced hydroxyurea (500 mg on Monday and Thursday) due to increased platelet counts. Current regimen well-tolerated with no side effects. Platelet count at 365,000, with normal hemoglobin, hematocrit, and white blood count. Plan to continue current regimen given stable blood counts. Discussed extending follow-up visits to every four to six months based on stable condition. 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 - Encourage continued regular exercise and active lifestyle.   The patient was advised to call immediately if she has any concerning symptoms in the interval. The patient voices understanding of current disease status and treatment  options and is in agreement with the current care plan. All questions were answered. The patient knows to call the clinic with any problems, questions or concerns. We can certainly see the patient much sooner if necessary.  Disclaimer: This note was dictated with voice recognition software. Similar sounding words can inadvertently be transcribed and may not be corrected upon review.

## 2023-08-06 ENCOUNTER — Other Ambulatory Visit: Payer: 59

## 2023-08-06 ENCOUNTER — Ambulatory Visit: Payer: 59 | Admitting: Internal Medicine

## 2023-11-01 LAB — HM PAP SMEAR: HPV, high-risk: POSITIVE

## 2023-11-01 LAB — RESULTS CONSOLE HPV: CHL HPV: POSITIVE

## 2023-11-11 ENCOUNTER — Encounter: Payer: Self-pay | Admitting: Internal Medicine

## 2023-11-12 ENCOUNTER — Encounter: Payer: Self-pay | Admitting: Internal Medicine

## 2023-12-03 ENCOUNTER — Inpatient Hospital Stay: Payer: 59 | Attending: Internal Medicine

## 2023-12-03 ENCOUNTER — Inpatient Hospital Stay (HOSPITAL_BASED_OUTPATIENT_CLINIC_OR_DEPARTMENT_OTHER): Payer: 59 | Admitting: Internal Medicine

## 2023-12-03 VITALS — BP 117/79 | HR 81 | Temp 98.2°F | Resp 16 | Ht 62.0 in | Wt 121.7 lb

## 2023-12-03 DIAGNOSIS — D473 Essential (hemorrhagic) thrombocythemia: Secondary | ICD-10-CM | POA: Diagnosis not present

## 2023-12-03 DIAGNOSIS — Z7982 Long term (current) use of aspirin: Secondary | ICD-10-CM | POA: Insufficient documentation

## 2023-12-03 LAB — CBC WITH DIFFERENTIAL (CANCER CENTER ONLY)
Abs Immature Granulocytes: 0.01 10*3/uL (ref 0.00–0.07)
Basophils Absolute: 0.1 10*3/uL (ref 0.0–0.1)
Basophils Relative: 1 %
Eosinophils Absolute: 0.1 10*3/uL (ref 0.0–0.5)
Eosinophils Relative: 1 %
HCT: 39.1 % (ref 36.0–46.0)
Hemoglobin: 13.2 g/dL (ref 12.0–15.0)
Immature Granulocytes: 0 %
Lymphocytes Relative: 34 %
Lymphs Abs: 2.2 10*3/uL (ref 0.7–4.0)
MCH: 30.1 pg (ref 26.0–34.0)
MCHC: 33.8 g/dL (ref 30.0–36.0)
MCV: 89.3 fL (ref 80.0–100.0)
Monocytes Absolute: 0.6 10*3/uL (ref 0.1–1.0)
Monocytes Relative: 9 %
Neutro Abs: 3.5 10*3/uL (ref 1.7–7.7)
Neutrophils Relative %: 55 %
Platelet Count: 394 10*3/uL (ref 150–400)
RBC: 4.38 MIL/uL (ref 3.87–5.11)
RDW: 13.4 % (ref 11.5–15.5)
WBC Count: 6.3 10*3/uL (ref 4.0–10.5)
nRBC: 0 % (ref 0.0–0.2)

## 2023-12-03 LAB — LACTATE DEHYDROGENASE: LDH: 145 U/L (ref 98–192)

## 2023-12-03 LAB — CMP (CANCER CENTER ONLY)
ALT: 15 U/L (ref 0–44)
AST: 19 U/L (ref 15–41)
Albumin: 4.2 g/dL (ref 3.5–5.0)
Alkaline Phosphatase: 46 U/L (ref 38–126)
Anion gap: 5 (ref 5–15)
BUN: 7 mg/dL (ref 6–20)
CO2: 31 mmol/L (ref 22–32)
Calcium: 9 mg/dL (ref 8.9–10.3)
Chloride: 105 mmol/L (ref 98–111)
Creatinine: 1.02 mg/dL — ABNORMAL HIGH (ref 0.44–1.00)
GFR, Estimated: >60 mL/min (ref >60)
Glucose, Bld: 71 mg/dL (ref 70–99)
Potassium: 4.2 mmol/L (ref 3.5–5.1)
Sodium: 141 mmol/L (ref 135–145)
Total Bilirubin: 1.6 mg/dL — ABNORMAL HIGH (ref 0.0–1.2)
Total Protein: 7 g/dL (ref 6.5–8.1)

## 2023-12-03 NOTE — Progress Notes (Signed)
 [facility] [phone]   [phone]  OFFICE PROGRESS NOTE  [name], MD [address] [place] [address]  DIAGNOSIS: Essential thrombocythemia diagnosed in May 2019.  Positive for JAK 2 mutation.   PRIOR THERAPY: Hydroxyurea  500 mg daily.  The first dose was started on 01/01/2018.  Discontinued 12/11/2021   CURRENT THERAPY: Aspirin 325 mg p.o. daily started Dec 11, 2021 in addition to hydroxyurea  500 mg p.o. on Monday and Thursday every week  INTERVAL HISTORY: Darlene Davis 48 y.o. female returns to the clinic today for follow-up visit.  Discussed the use of AI scribe software for clinical note transcription with the patient, who gave verbal consent to proceed.  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  Her condition remains stable with no new symptoms such as aches, pains, or bruising. No changes in breathing or other concerning complaints are noted.  She continues her medication regimen of Hydrea  on Mondays and Thursdays and aspirin daily except on those days, which she finds effective in managing her condition.  There has been a weight loss of five pounds since December, now weighing 121.7 pounds, down from 126 pounds. She attributes this to irregular breakfast habits and refers to it as 'winter weight'.  No other symptoms or changes in her health status are reported.        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  MEDICATIONS:  Current Outpatient Medications  Medication Sig Dispense Refill   Ascorbic Acid (VITAMIN C PO) Take  by mouth.     aspirin EC 81 MG tablet Take 81 mg by mouth daily. Swallow whole.     Cholecalciferol (VITAMIN D  PO) Take by mouth.     hydroxyurea  (HYDREA ) 500 MG capsule 1 tablet p.o. on Mondays and Thursdays.  May take with food to minimize GI side effects. 90 capsule 1   Multiple Vitamins-Iron (MULTIVITAMINS WITH IRON) TABS tablet Take 1 tablet by mouth daily.     SPRINTEC 28 0.25-35 MG-MCG tablet Take 1 tablet by mouth daily.     valACYclovir (VALTREX) 1000 MG tablet Take by mouth.     No current facility-administered medications for this visit.    SURGICAL HISTORY:  Past Surgical History:  Procedure Laterality Date   NO PAST SURGERIES      REVIEW OF SYSTEMS:  A comprehensive review of systems was negative.   PHYSICAL EXAMINATION: General appearance: alert, cooperative, and no distress Head: Normocephalic, without obvious abnormality, atraumatic Neck: no adenopathy, no JVD, supple, symmetrical, trachea midline, and thyroid  not enlarged, symmetric, no tenderness/mass/nodules Lymph nodes: Cervical, supraclavicular, and axillary nodes normal. Resp: clear to auscultation bilaterally Back: symmetric, no curvature. ROM normal. No CVA tenderness. Cardio: regular rate and rhythm, S1, S2 normal, no murmur, click, rub or gallop GI: soft, non-tender; bowel sounds normal; no masses,  no organomegaly Extremities: extremities normal, atraumatic, no cyanosis or edema  ECOG PERFORMANCE STATUS: 0 - Asymptomatic  Blood pressure 117/79, pulse 81, temperature 98.2 F (36.8 C), temperature source Temporal, resp. rate 16, height 5\' 2"  (1.575 m),  weight 121 lb 11.2 oz (55.2 kg), SpO2 100%.  LABORATORY DATA: Lab Results  Component Value Date   WBC 6.3 12/03/2023   HGB 13.2 12/03/2023   HCT 39.1 12/03/2023   MCV 89.3 12/03/2023   PLT 394 12/03/2023      Chemistry      Component Value Date/Time   NA 138 08/05/2023 1501   K 4.0 08/05/2023 1501   CL 101 08/05/2023 1501   CO2 31 08/05/2023  1501   BUN 11 08/05/2023 1501   CREATININE 0.98 08/05/2023 1501      Component Value Date/Time   CALCIUM 9.2 08/05/2023 1501   ALKPHOS 51 08/05/2023 1501   AST 21 08/05/2023 1501   ALT 18 08/05/2023 1501   BILITOT 0.8 08/05/2023 1501       RADIOGRAPHIC STUDIES: No results found.  ASSESSMENT AND PLAN: This is a very pleasant 48 years old African-American female with essential thrombocythemia and was treated initially with hydroxyurea  500 mg p.o. daily but now on Aspirin 325 mg po daily.   She is currently on hydroxyurea  500 mg only on Mondays and Thursdays every week in addition to the daily aspirin.  She is tolerating this treatment well. CBC today is unremarkable for any abnormality.    Essential thrombocythemia Essential thrombocythemia is well-managed with the current regimen. No new symptoms such as myalgia, ecchymosis, or dyspnea. A 5-pound weight loss since December is noted without associated concerning symptoms. CBC results are normal, indicating effective control. - Continue Hydrea  on Mondays and Thursdays. - Continue daily aspirin, except on Hydrea  days. - Extend follow-up visit to six months. - Advise to report any concerning symptoms.   The patient was advised to call immediately if she has any concerning symptoms in the interval. The patient voices understanding of current disease status and treatment options and is in agreement with the current care plan. All questions were answered. The patient knows to call the clinic with any problems, questions or concerns. We can certainly see the patient much sooner if necessary.  Disclaimer: This note was dictated with voice recognition software. Similar sounding words can inadvertently be transcribed and may not be corrected upon review.

## 2023-12-24 ENCOUNTER — Encounter: Payer: Self-pay | Admitting: Internal Medicine

## 2023-12-24 LAB — HM MAMMOGRAPHY

## 2024-02-03 ENCOUNTER — Encounter: Payer: Self-pay | Admitting: Internal Medicine

## 2024-02-03 ENCOUNTER — Ambulatory Visit (INDEPENDENT_AMBULATORY_CARE_PROVIDER_SITE_OTHER): Payer: 59 | Admitting: Internal Medicine

## 2024-02-03 VITALS — BP 118/82 | HR 78 | Temp 98.0°F | Resp 16 | Ht 62.0 in | Wt 120.1 lb

## 2024-02-03 DIAGNOSIS — Z23 Encounter for immunization: Secondary | ICD-10-CM | POA: Diagnosis not present

## 2024-02-03 DIAGNOSIS — Z0000 Encounter for general adult medical examination without abnormal findings: Secondary | ICD-10-CM

## 2024-02-03 LAB — LIPID PANEL
Cholesterol: 193 mg/dL (ref 0–200)
HDL: 76.9 mg/dL (ref >39.00)
LDL Cholesterol: 86 mg/dL (ref 0–99)
NonHDL: 115.64
Total CHOL/HDL Ratio: 3
Triglycerides: 150 mg/dL — ABNORMAL HIGH (ref 0.0–149.0)
VLDL: 30 mg/dL (ref 0.0–40.0)

## 2024-02-03 NOTE — Assessment & Plan Note (Signed)
 Here for CPX Td -2021  Per pt had a covid booster fall 2024 Vaccines I rec: flu shot, shingles shot is a consideration. Female Care: per gyn, PAP 10/2023 ([facility]);  [facility] 12/2023 CCS: Negative colonoscopy December 2022, next 10 years per C-scope report Labs: FLP, hepatitis B serology. Lifestyle: Doing great. RTC 1 year

## 2024-02-03 NOTE — Patient Instructions (Signed)
   Vaccines to consider: Flu shot every fall Shingles shot, Shingrix.  GO TO THE LAB :  Get the blood work   Your results will be posted on MyChart with my comments  Next office visit for a physical exam in 1 year Please make an appointment before you leave today

## 2024-02-03 NOTE — Progress Notes (Signed)
   Subjective:    Patient ID: Darlene Davis, female    DOB: 1976/04/21, 48 y.o.   MRN: [id number]  DOS:  02/03/2024 Type of visit - description: Here for CPX  Doing well.  Has no concerns.  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   aspirin EC 81 mg, Daily   Cholecalciferol (VITAMIN D  PO) Take by mouth.   hydroxyurea  (HYDREA ) 500 MG capsule 1 tablet p.o. on Mondays and Thursdays.  May take with food to minimize GI side effects.   Multiple Vitamins-Iron (MULTIVITAMINS WITH IRON) TABS tablet 1 tablet, Daily   SPRINTEC 28 0.25-35 MG-MCG tablet 1 tablet, Daily   valACYclovir (VALTREX) 1000 MG tablet Take by mouth.       Objective:   Physical Exam BP 118/82   Pulse 78   Temp 98 F (36.7 C) (Oral)   Resp 16   Ht 5' 2 (1.575 m)   Wt 120 lb 2 oz (54.5 kg)   SpO2 97%   BMI 21.97 kg/m  General: Well developed, NAD, BMI noted Neck: No  thyromegaly  HEENT:  Normocephalic . Face symmetric, atraumatic Lungs:  CTA B Normal respiratory effort, no intercostal retractions, no accessory muscle use. Heart: RRR,  no murmur.  Abdomen:  Not distended, soft, non-tender. No rebound or rigidity.   Lower extremities: no pretibial edema bilaterally  Skin: Exposed areas without rash. Not pale. Not jaundice Neurologic:  alert & oriented X3.  Speech normal, gait appropriate for age and unassisted Strength symmetric and appropriate  for age.  Psych: Cognition and judgment appear intact.  Cooperative with normal attention span and concentration.  Behavior appropriate. No anxious or depressed appearing.     Assessment    Assessment Essential thrombocythemia, sees hem-onc  PLAN Here for CPX Td -2021  Per pt had a covid booster fall 2024 Vaccines I rec: flu shot, shingles shot is a consideration. Female Care: per gyn, PAP 10/2023 ([facility]);  [facility] 12/2023 CCS: Negative colonoscopy December 2022, next 10 years per C-scope report Labs: FLP, hepatitis B serology. Lifestyle: Doing great. RTC 1 year

## 2024-02-04 ENCOUNTER — Ambulatory Visit: Payer: Self-pay | Admitting: Internal Medicine

## 2024-02-04 ENCOUNTER — Encounter: Payer: 59 | Admitting: Internal Medicine

## 2024-02-04 LAB — HEPATITIS B SURFACE ANTIGEN: Hepatitis B Surface Ag: NONREACTIVE

## 2024-02-04 LAB — HEPATITIS B SURFACE ANTIBODY,QUALITATIVE: Hep B S Ab: NONREACTIVE

## 2024-04-11 ENCOUNTER — Other Ambulatory Visit: Payer: Self-pay | Admitting: Internal Medicine

## 2024-05-12 ENCOUNTER — Inpatient Hospital Stay: Attending: Internal Medicine

## 2024-05-12 ENCOUNTER — Inpatient Hospital Stay: Admitting: Internal Medicine

## 2024-05-12 VITALS — BP 116/72 | HR 74 | Temp 98.0°F | Resp 17 | Ht 62.0 in | Wt 123.9 lb

## 2024-05-12 DIAGNOSIS — D473 Essential (hemorrhagic) thrombocythemia: Secondary | ICD-10-CM

## 2024-05-12 DIAGNOSIS — Z7982 Long term (current) use of aspirin: Secondary | ICD-10-CM | POA: Insufficient documentation

## 2024-05-12 DIAGNOSIS — D75839 Thrombocytosis, unspecified: Secondary | ICD-10-CM | POA: Diagnosis not present

## 2024-05-12 DIAGNOSIS — Z7964 Long term (current) use of myelosuppressive agent: Secondary | ICD-10-CM | POA: Diagnosis not present

## 2024-05-12 LAB — CMP (CANCER CENTER ONLY)
ALT: 23 U/L (ref 0–44)
AST: 23 U/L (ref 15–41)
Albumin: 4.4 g/dL (ref 3.5–5.0)
Alkaline Phosphatase: 73 U/L (ref 38–126)
Anion gap: 4 — ABNORMAL LOW (ref 5–15)
BUN: 9 mg/dL (ref 6–20)
CO2: 35 mmol/L — ABNORMAL HIGH (ref 22–32)
Calcium: 10 mg/dL (ref 8.9–10.3)
Chloride: 102 mmol/L (ref 98–111)
Creatinine: 0.92 mg/dL (ref 0.44–1.00)
GFR, Estimated: >60 mL/min (ref >60)
Glucose, Bld: 85 mg/dL (ref 70–99)
Potassium: 4 mmol/L (ref 3.5–5.1)
Sodium: 141 mmol/L (ref 135–145)
Total Bilirubin: 1.1 mg/dL (ref 0.0–1.2)
Total Protein: 7.9 g/dL (ref 6.5–8.1)

## 2024-05-12 LAB — CBC WITH DIFFERENTIAL (CANCER CENTER ONLY)
Abs Immature Granulocytes: 0.01 K/uL (ref 0.00–0.07)
Basophils Absolute: 0.1 K/uL (ref 0.0–0.1)
Basophils Relative: 1 %
Eosinophils Absolute: 0.1 K/uL (ref 0.0–0.5)
Eosinophils Relative: 1 %
HCT: 43 % (ref 36.0–46.0)
Hemoglobin: 14.4 g/dL (ref 12.0–15.0)
Immature Granulocytes: 0 %
Lymphocytes Relative: 31 %
Lymphs Abs: 2.4 K/uL (ref 0.7–4.0)
MCH: 29.4 pg (ref 26.0–34.0)
MCHC: 33.5 g/dL (ref 30.0–36.0)
MCV: 87.8 fL (ref 80.0–100.0)
Monocytes Absolute: 0.6 K/uL (ref 0.1–1.0)
Monocytes Relative: 8 %
Neutro Abs: 4.6 K/uL (ref 1.7–7.7)
Neutrophils Relative %: 59 %
Platelet Count: 427 K/uL — ABNORMAL HIGH (ref 150–400)
RBC: 4.9 MIL/uL (ref 3.87–5.11)
RDW: 13 % (ref 11.5–15.5)
WBC Count: 7.8 K/uL (ref 4.0–10.5)
nRBC: 0 % (ref 0.0–0.2)

## 2024-05-12 LAB — LACTATE DEHYDROGENASE: LDH: 163 U/L (ref 98–192)

## 2024-05-12 NOTE — Progress Notes (Signed)
 [facility] [phone]   [phone]  OFFICE PROGRESS NOTE  Amon Aloysius BRAVO, MD [address] [place] [address]  DIAGNOSIS: Essential thrombocythemia diagnosed in May 2019.  Positive for JAK 2 mutation.   PRIOR THERAPY: Hydroxyurea  500 mg daily.  The first dose was started on 01/01/2018.  Discontinued 12/11/2021   CURRENT THERAPY: Aspirin 325 mg p.o. daily started Dec 11, 2021 in addition to hydroxyurea  500 mg p.o. on Monday and Thursday every week  INTERVAL HISTORY: Lawernce LITTIE Buddle 48 y.o. female returns to the clinic today for follow-up visit.  Discussed the use of AI scribe software for clinical note transcription with the patient, who gave verbal consent to proceed.  History of Present Illness YNEZ EUGENIO is a 48 year old female with essential thrombocythemia who presents for evaluation and repeat blood work.  She has a history of essential thrombocythemia, diagnosed in May 2019, with a positive JAK2 mutation. Her current medication regimen includes aspirin 325 mg taken orally every day and hydroxyurea  500 mg taken only on Mondays and Thursdays.  No new symptoms, aches, pains, or changes in her condition since her last visit. No bleeding or other issues are reported. Her platelet count has increased from 394,000 to 427,000.  She has not started any new medications, including hormones, that could affect her condition. She is out of refills for hydroxyurea  and has received a notification from the pharmacy regarding this.    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  MEDICATIONS:  Current Outpatient Medications  Medication  Sig Dispense Refill   Ascorbic Acid (VITAMIN C PO) Take by mouth.     aspirin EC 81 MG tablet Take 81 mg by mouth daily. Swallow whole.     Cholecalciferol (VITAMIN D  PO) Take by mouth.     hydroxyurea  (HYDREA ) 500 MG capsule TAKE 1 CAPSULE BY MOUTH ON MONDAYS AND THURSDAYS. MAY TAKE WITH FOOD TO MINIMIZE GI SIDE EFFECTS 8 capsule 0   Multiple Vitamins-Iron (MULTIVITAMINS WITH IRON) TABS tablet Take 1 tablet by mouth daily.     SPRINTEC 28 0.25-35 MG-MCG tablet Take 1 tablet by mouth daily.     valACYclovir (VALTREX) 1000 MG tablet Take by mouth.     No current facility-administered medications for this visit.    SURGICAL HISTORY:  Past Surgical History:  Procedure Laterality Date   NO PAST SURGERIES      REVIEW OF SYSTEMS:  A comprehensive review of systems was negative.   PHYSICAL EXAMINATION: General appearance: alert, cooperative, and no distress Head: Normocephalic, without obvious abnormality, atraumatic Neck: no adenopathy, no JVD, supple, symmetrical, trachea midline, and thyroid  not enlarged, symmetric, no tenderness/mass/nodules Lymph nodes: Cervical, supraclavicular, and axillary nodes normal. Resp: clear to auscultation bilaterally Back: symmetric, no curvature. ROM normal. No CVA tenderness. Cardio: regular rate and rhythm, S1, S2 normal, no murmur, click, rub or gallop GI: soft, non-tender; bowel sounds normal; no masses,  no organomegaly Extremities: extremities normal, atraumatic, no cyanosis or edema  ECOG PERFORMANCE STATUS: 0 - Asymptomatic  Blood pressure 116/72, pulse 74, temperature 98 F (36.7 C), resp. rate 17, height 5' 2 (1.575 m), weight 123  lb 14.4 oz (56.2 kg), SpO2 96%.  LABORATORY DATA: Lab Results  Component Value Date   WBC 7.8 05/12/2024   HGB 14.4 05/12/2024   HCT 43.0 05/12/2024   MCV 87.8 05/12/2024   PLT 427 (H) 05/12/2024      Chemistry      Component Value Date/Time   NA 141 12/03/2023 0751   K 4.2 12/03/2023 0751   CL 105  12/03/2023 0751   CO2 31 12/03/2023 0751   BUN 7 12/03/2023 0751   CREATININE 1.02 (H) 12/03/2023 0751      Component Value Date/Time   CALCIUM 9.0 12/03/2023 0751   ALKPHOS 46 12/03/2023 0751   AST 19 12/03/2023 0751   ALT 15 12/03/2023 0751   BILITOT 1.6 (H) 12/03/2023 0751       RADIOGRAPHIC STUDIES: No results found.  ASSESSMENT AND PLAN: This is a very pleasant 48 years old African-American female with essential thrombocythemia and was treated initially with hydroxyurea  500 mg p.o. daily but now on Aspirin 325 mg po daily.   She is currently on hydroxyurea  500 mg only on Mondays and Thursdays every week in addition to the daily aspirin.  She is tolerating this treatment well. CBC showed platelets count of 427,000. Assessment and Plan Assessment & Plan Essential thrombocythemia with JAK2 mutation Essential thrombocythemia diagnosed in May 2019 with a positive JAK2 mutation. Current platelet count is 427,000, a slight increase from the previous count of 394,000 but not concerning enough to warrant a change in treatment. No new symptoms, bleeding, or issues reported. The current treatment regimen is effective in managing the condition. - Continue aspirin 325 mg PO daily. - Continue hydroxyurea  500 mg on Mondays and Thursdays. - Send refill for hydroxyurea  to pharmacy. - Monitor platelet count; no changes unless significant increase. - Review comprehensive metabolic panel and LDH results when available. - Schedule follow-up appointment in six months. The patient was advised to call immediately if she has any concerning symptoms in the interval.  The patient voices understanding of current disease status and treatment options and is in agreement with the current care plan. All questions were answered. The patient knows to call the clinic with any problems, questions or concerns. We can certainly see the patient much sooner if necessary.  Disclaimer: This note was dictated with  voice recognition software. Similar sounding words can inadvertently be transcribed and may not be corrected upon review.

## 2024-05-14 ENCOUNTER — Other Ambulatory Visit: Payer: Self-pay | Admitting: Internal Medicine

## 2024-06-10 ENCOUNTER — Other Ambulatory Visit: Payer: Self-pay | Admitting: Internal Medicine

## 2024-06-10 ENCOUNTER — Other Ambulatory Visit: Payer: Self-pay

## 2024-06-10 MED ORDER — HYDROXYUREA 500 MG PO CAPS: ORAL_CAPSULE | ORAL | 0 refills | Status: DC

## 2024-07-06 ENCOUNTER — Other Ambulatory Visit: Payer: Self-pay | Admitting: Internal Medicine

## 2024-08-02 ENCOUNTER — Other Ambulatory Visit: Payer: Self-pay | Admitting: Internal Medicine

## 2024-08-31 ENCOUNTER — Other Ambulatory Visit: Payer: Self-pay | Admitting: Internal Medicine

## 2024-11-24 ENCOUNTER — Ambulatory Visit: Admitting: Internal Medicine

## 2024-11-24 ENCOUNTER — Other Ambulatory Visit

## 2024-11-25 ENCOUNTER — Other Ambulatory Visit

## 2024-11-25 ENCOUNTER — Ambulatory Visit: Admitting: Internal Medicine

## 2025-02-02 ENCOUNTER — Encounter: Admitting: Internal Medicine
